# Patient Record
Sex: Male | Born: 1950 | Race: White | Hispanic: No | Marital: Married
Health system: Midwestern US, Academic
[De-identification: ages and names within clinical notes are randomized; demographics above are authoritative.]

---
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
## 2008-02-21 NOTE — Unmapped (Signed)
Signed by   LinkLog[employer] on 02/25/2008 at 08:44:31  Patient: Arthur Neal  Note: All result statuses are Final unless otherwise noted.    Tests: (1) DIAG-FLUORO UP TO 1 HOUR ([id number])    Order NotePricilla Handler Order Number: [id number]    Order Note:     *** VERIFIED [facility]  Reason:  FX  Dict.Staff: Gwenyth Ober [phone]    Verified By: Gwenyth Ober      Ver: 02/25/08   8:43 am  Exams:  DIAG-FLUORO UP TO 1 HOUR      Fluoroscopy performed on February 20, 2008 8:51:00 PM    INDICATION:    Fracture    Fluoroscopy was carried out, without a radiologist in  attendance, to provide patient care necessary for a specific  diagnostic or therapeutic procedure performed by a  nonradiologist.    A report by the performing physician should be available in the  medical record.  **** end of result ****    Order Note:   EMR Routing to: Aubery Lapping - ordering - [id number]  EMR Routing to: Bertram Savin, MD - admitting - [id number]    Note: An exclamation mark (!) indicates a result that was not dispersed into   the flowsheet.  Document Creation Date: 02/25/2008 8:44 AM  _______________________________________________________________________    (1) Order result status: Final  Collection or observation date-time: 02/21/2008 02:59:04  Requested date-time: 02/20/2008 20:51:00  Receipt date-time:   Reported date-time: 02/25/2008 08:43:36  Referring Physician: Nelida Gores REFERRAL PT  Ordering Physician: Marylyn Ishihara [facility])  Specimen Source:   Source: QRS  Filler Order Number: [id number]  Lab site: [facility]
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
## 2008-03-12 ENCOUNTER — Inpatient Hospital Stay

## 2008-03-13 NOTE — Unmapped (Signed)
Signed by   Link[employer] on 04/24/2008 at 11:13:16  Patient: Arthur Neal  Note: All result statuses are Final unless otherwise noted.    Tests: (1)  (MR)    Order Note:                                   THE [facility]     PATIENT NAME:   TODD, JELINSKI                       MR #:  [id number]  DATE OF BIRTH:  1951/08/20                        ACCOUNT #:  [id number]  SURGEON:        Beulah Gandy. Wyrick, M.D.              ROOM #:  [id number]  SERVICE:        Orthopedic Surgery                NURSING UNIT:  [facility]  PRIMARY:        Selected Referral Pt              FC:  C  REFERRING:      Selected Referral Pt              ADMIT DATE:  02/20/2008  DICTATED BY:    Beatriz Stallion, M.D.             SURGERY DATE:  03/13/2008                                                    DISCHARGE DATE:  04/03/2008                                    OPERATIVE REPORT        ADDENDUM 04/24/2008 - skb     Dr. Marylyn Ishihara was present for the entire case and was the attending  physician.                                                             _______________________________________  SB/skb                                 _____  D:  04/23/2008 15:59                  John D. Linna Darner, M.D.  T:  04/24/2008 11:03                  Dictated by:  Beatriz Stallion, M.D.  Job #:  [id number]     c:   Anesthesia  OPERATIVE REPORT                                                               PAGE    1 of   1    Note: An exclamation mark (!) indicates a result that was not dispersed into   the flowsheet.  Document Creation Date: 04/24/2008 11:13 AM  _______________________________________________________________________    (1) Order result status: Final  Collection or observation date-time: 03/13/2008 00:00  Requested date-time:   Receipt date-time:   Reported date-time:   Referring Physician: Selected Pt  Ordering Physician:  Reviewed In Hospital [facility])  Specimen Source:   Source: DBS  Filler Order Number: [id number]  Lab site:
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
## 2008-03-20 NOTE — Unmapped (Signed)
Signed by   LinkLogic on 03/23/2008 at 19:25:48  Patient: Arthur Neal  Note: All result statuses are Final unless otherwise noted.    Tests: (1)  (MR)    Order Note:                                   THE [facility]     PATIENT NAME:   Arthur Neal, Arthur Neal                       MR #:  [id number]  DATE OF BIRTH:  14-Dec-1950                        ACCOUNT #:  [id number]  SURGEON:        Beulah Gandy. Wyrick, M.D.              ROOM #:  BS06  SERVICE:        Orthopedic Surgery                NURSING UNIT:  [facility]  PRIMARY:        Selected Referral Pt              FC:  C  REFERRING:      Selected Referral Pt              ADMIT DATE:  02/20/2008  DICTATED BY:    Desmond Dike, M.D.               SURGERY DATE:  03/20/2008                                                    DISCHARGE DATE:                                    OPERATIVE REPORT        PREOPERATIVE DIAGNOSIS(ES):     1. Right below-the-knee amputation.     POSTOPERATIVE DIAGNOSIS(ES):     1. Right below-the-knee amputation.     PROCEDURE(S) PERFORMED:     1. Irrigation and debridement below-the-knee amputation stump.  2. Placement of wound-vac to right below-the-knee amputation stump.     SURGEON:  Beulah Gandy.  Wyrick M.D.     ASSISTANT:  Desmond Dike M.D.     ANESTHESIA:  General.     ESTIMATED BLOOD LOSS:  Less than 50 mL.     TOURNIQUET TIME:  17 minutes.     DRAINS:  None.     SPECIMENS:  One muscle.  Biopsy sent to microbiology for aerobic/anaerobic.     DETAILS OF PROCEDURE: The patient is a 57 year old man who has undergone  multiple irrigation and debridements of his right lower extremity.  He was  taken to the operating room on 03/17/08 for a right below-the-knee  amputation.  The wound was left open and closed with a wound-vac.  He is  being brought back to the operating room on 03/20/08 for irrigation and  debridement and possible revision of the right below-the-knee amputation  stump.  The patient was consented for this procedure preoperatively and  was  transferred to the  operating room on 03/20/08.  He was induced under general  anesthesia by the anesthesia department.  He was transferred onto the  operating room table and placed in a supine position.  A tourniquet was  applied to his upper right leg.  The right leg was then prepped and draped in  the usual sterile fashion. After elevating the leg for approximately one  minute, the tourniquet was inflated.  An appropriate time-out was performed.  It was noted that he did still have some necrotic muscle present,  specifically within his gastroc.  This muscle was debrided back until  bleeding muscle edges were seen.  All neurovascular structures were  protected.  The stump was then thoroughly irrigated with pulse-A-vac lavage,  a total of 9 liters with 3 liters containing Bacitracin.  A wound-vac was  then applied to the right below stump, noting to cover the deep tissues and  cover the exposed tibia and hardware with the vac sponge.  The wound-vac was  then noted to have an adequate seal when hooked to its cannister.  The  patient was then awoken from anesthesia without complications.  He was  transferred to the post anesthesia care unit postoperatively.  Dr. Linna Darner was  present and scrubbed for all key portions of this procedure.  Qkv                                                 _______________________________________  NN/bpb                                 _____  D:  03/20/2008 09:30                  John D. Linna Darner, M.D.  T:  03/20/2008 16:20                  Dictated by:  Desmond Dike, M.D.  Job #:  [id number]     c:   Anesthesia                                    OPERATIVE REPORT                                                               PAGE    1 of   1    Note: An exclamation mark (!) indicates a result that was not dispersed into   the flowsheet.  Document Creation Date: 03/23/2008 7:25 PM  _______________________________________________________________________    (1) Order result status:  Final  Collection or observation date-time: 03/20/2008 00:00  Requested date-time:   Receipt date-time:   Reported date-time:   Referring Physician: Selected Pt  Ordering Physician:  Reviewed In Hospital [facility])  Specimen Source:   Source: DBS  Filler Order Number: [id number]  Lab site:
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
## 2008-03-22 NOTE — Unmapped (Signed)
Signed by   LinkLog[employer] on 03/24/2008 at 00:27:27  Patient: [name]  Note: All result statuses are Final unless otherwise noted.    Tests: (1)  (MR)    Order Note:                                   THE [facility]     PATIENT NAME:   Arthur Neal, Arthur Neal                       MR #:  [id number]  DATE OF BIRTH:  06/21/1951                        ACCOUNT #:  [id number]  SURGEON:        Gareth Eagle, M.D.                ROOM #:  BS06  SERVICE:        Orthopedics                       NURSING UNIT:  [facility]  PRIMARY:        Selected Referral Pt              FC:  C  REFERRING:      Selected Referral Pt              ADMIT DATE:  02/20/2008  DICTATED BY:    Juanito Doom, M.D.               SURGERY DATE:  03/22/2008                                                    DISCHARGE DATE:                                    OPERATIVE REPORT        SURGEON:  Gareth Eagle, M.D.     ASSISTANT:  Juanito Doom, M.D.     PREOPERATIVE DIAGNOSIS:     1. Status post right below knee amputation, guillotine.     POSTOPERATIVE DIAGNOSIS:     1. Status post right below knee amputation, guillotine.     PROCEDURE PERFORMED:     1. Irrigation and debridement of right below knee amputation stump with    reapplication of VAC dressing.     ANESTHESIA:  General.     COMPLICATIONS:  None.     DISPOSITION:  To PACU in stable condition.     DETAILS OF PROCEDURE:  After informed consent had been obtained, the patient  was brought to the operating room and placed in the supine position with all  of his prominences appropriately padded.  A hold point had been appropriately  observed while the patient was awake and then general anesthesia was  successfully induced.     After the appropriate padding and positioning of the patient, the right lower  extremity was sterilely prepped and draped in the usual standard fashion.  Preoperative antibiotics were given.  The wound was inspected and no gross  purulence was noted.  There was no  gross necrotic tissue noted.   Distally at  the edge of the stump, however, some of the muscles from the lateral and  posterior compartments appeared pale and mildly necrotic at their edges.  These were appropriately debrided using a scalpel.  Then, a curet was used to  appropriately debride all of the visible tissue.  The plate was intact  showing no signs of loosening.  The bone also appeared to be healthy without  any signs of infection.     The wound was then copiously irrigated with a total of 9 liters of solution  with a Pulsavac.  The middle 3 liters had bacitracin antibiotic laced in it.  After the irrigation of the wound, the extremity was dried and then a VAC  dressing was appropriately applied.     The patient experienced no intraoperative complications.  Dr. Gareth Eagle  was immediately available for assistance.                                                                _______________________________________  OAK/km                                 _____  D:  03/22/2008 14:15                  Gareth Eagle, M.D.  T:  03/24/2008 00:22                  Dictated by:  Juanito Doom, M.D.  Job #:  [id number]     c:   Anesthesia                                    OPERATIVE REPORT                                                               PAGE    1 of   1    Note: An exclamation mark (!) indicates a result that was not dispersed into   the flowsheet.  Document Creation Date: 03/24/2008 12:27 AM  _______________________________________________________________________    (1) Order result status: Final  Collection or observation date-time: 03/22/2008 00:00  Requested date-time:   Receipt date-time:   Reported date-time:   Referring Physician: Selected Pt  Ordering Physician:  Reviewed In Hospital [facility])  Specimen Source:   Source: DBS  Filler Order Number: [id number]  Lab site:

## 2008-03-23 NOTE — Unmapped (Signed)
THE [facility]     PATIENT NAME:   RAINEY, KAHRS                       MR #:  [id number]   DATE OF BIRTH:  [birth year]                        ACCOUNT #:  [id number]   SURGEON:        Beulah Gandy. Wyrick, M.D.              ROOM #:  BS06   SERVICE:        Orthopedic Surgery                NURSING UNIT:  [facility]   PRIMARY:        Selected Referral Pt              FC:  C   REFERRING:      Selected Referral Pt              ADMIT DATE:  02/20/2008   DICTATED BY:    Desmond Dike, M.D.               SURGERY DATE:  03/20/2008                                                     DISCHARGE DATE:                                    OPERATIVE REPORT       PREOPERATIVE DIAGNOSIS(ES):     1. Right below-the-knee amputation.     POSTOPERATIVE DIAGNOSIS(ES):     1. Right below-the-knee amputation.     PROCEDURE(S) PERFORMED:     1. Irrigation and debridement below-the-knee amputation stump.   2. Placement of wound-vac to right below-the-knee amputation stump.     SURGEON:  Beulah Gandy.  Wyrick M.D.     ASSISTANT:  Desmond Dike M.D.     ANESTHESIA:  General.     ESTIMATED BLOOD LOSS:  Less than 50 mL.     TOURNIQUET TIME:  17 minutes.     DRAINS:  None.     SPECIMENS:  One muscle.  Biopsy sent to microbiology for aerobic/anaerobic.     DETAILS OF PROCEDURE: The patient is a 57 year old man who has undergone   multiple irrigation and debridements of his right lower extremity.  He was   taken to the operating room on 03/17/08 for a right below-the-knee   amputation.  The wound was left open and closed with a wound-vac.  He is   being brought back to the operating room on 03/20/08 for irrigation and   debridement and possible revision of the right below-the-knee amputation   stump.  The patient was consented for this procedure preoperatively and was   transferred to the operating room on 03/20/08.  He was induced under general   anesthesia by the anesthesia department.  He was transferred onto the   operating room  table and placed in a supine position.  A tourniquet was   applied to his upper right leg.  The right leg was then prepped and draped  in   the usual sterile fashion. After elevating the leg for approximately one   minute, the tourniquet was inflated.  An appropriate time-out was performed.   It was noted that he did still have some necrotic muscle present,   specifically within his gastroc.  This muscle was debrided back until   bleeding muscle edges were seen.  All neurovascular structures were   protected.  The stump was then thoroughly irrigated with pulse-A-vac lavage,   a total of 9 liters with 3 liters containing Bacitracin.  A wound-vac was   then applied to the right below stump, noting to cover the deep tissues and   cover the exposed tibia and hardware with the vac sponge.  The wound-vac was   then noted to have an adequate seal when hooked to its cannister.  The   patient was then awoken from anesthesia without complications.  He was   transferred to the post anesthesia care unit postoperatively.  Dr. Linna Darner was   present and scrubbed for all key portions of this procedure.   Qkv                                               _______________________________________   NN/bpb                                 _____   D:  03/20/2008 09:30                  John D. Linna Darner, M.D.   T:  03/20/2008 16:20                  Dictated by:  Desmond Dike, M.D.   Job #:  [id number]     c:   Anesthesia                                    OPERATIVE REPORT                                                                PAGE    1 of   1   T:  03/20/2008 16:20                  Dictated by:  Desmond Dike, M.D.   Job #:  [id number]     c:   Anesthesia                                    OPERATIVE REPORT                                                                PAGE    1 of   1

## 2008-03-24 NOTE — Unmapped (Signed)
THE [facility]     PATIENT NAME:   Arthur Neal, Arthur Neal                       MR #:  [id number]   DATE OF BIRTH:  Mar 12, 1951                        ACCOUNT #:  [id number]   SURGEON:        Gareth Eagle, M.D.                ROOM #:  BS06   SERVICE:        Orthopedics                       NURSING UNIT:  [facility]   PRIMARY:        Selected Referral Pt              FC:  C   REFERRING:      Selected Referral Pt              ADMIT DATE:  02/20/2008   DICTATED BY:    Juanito Doom, M.D.               SURGERY DATE:  03/22/2008                                                     DISCHARGE DATE:                                    OPERATIVE REPORT       SURGEON:  Gareth Eagle, M.D.     ASSISTANT:  Juanito Doom, M.D.     PREOPERATIVE DIAGNOSIS:     1. Status post right below knee amputation, guillotine.     POSTOPERATIVE DIAGNOSIS:     1. Status post right below knee amputation, guillotine.     PROCEDURE PERFORMED:     1. Irrigation and debridement of right below knee amputation stump with     reapplication of VAC dressing.     ANESTHESIA:  General.     COMPLICATIONS:  None.     DISPOSITION:  To PACU in stable condition.     DETAILS OF PROCEDURE:  After informed consent had been obtained, the patient   was brought to the operating room and placed in the supine position with all   of his prominences appropriately padded.  A hold point had been appropriately   observed while the patient was awake and then general anesthesia was   successfully induced.     After the appropriate padding and positioning of the patient, the right lower   extremity was sterilely prepped and draped in the usual standard fashion.   Preoperative antibiotics were given.  The wound was inspected and no gross   purulence was noted.  There was no gross necrotic tissue noted.  Distally at   the edge of the stump, however, some of the muscles from the lateral and   posterior compartments appeared pale and mildly necrotic at their  edges.   These were appropriately debrided using a scalpel.  Then, a curet was used to   appropriately  debride all of the visible tissue.  The plate was intact   showing no signs of loosening.  The bone also appeared to be healthy without   any signs of infection.     The wound was then copiously irrigated with a total of 9 liters of solution   with a Pulsavac.  The middle 3 liters had bacitracin antibiotic laced in it.   After the irrigation of the wound, the extremity was dried and then a VAC   dressing was appropriately applied.     The patient experienced no intraoperative complications.  Dr. Gareth Eagle   was immediately available for assistance.                                                         _______________________________________   OAK/km                                 _____   D:  03/22/2008 14:15                  Gareth Eagle, M.D.   T:  03/24/2008 00:22                  Dictated by:  Juanito Doom, M.D.   Job #:  [id number]     c:   Anesthesia                                    OPERATIVE REPORT                                                                PAGE    1 of   1   T:  03/24/2008 00:22                  Dictated by:  Juanito Doom, M.D.   Job #:  [id number]     c:   Anesthesia                                    OPERATIVE REPORT                                                                PAGE    1 of   1

## 2008-03-25 NOTE — Unmapped (Signed)
Signed by   [employer] on 03/25/2008 at 22:25:59  Patient: Arthur Neal  Note: All result statuses are Final unless otherwise noted.    Tests: (1)  (MR)    Order Note:                                   THE [facility]     PATIENT NAME:   Arthur Neal, Arthur Neal                       MR #:  [id number]  DATE OF BIRTH:  12/30/1950                        ACCOUNT #:  [id number]  SURGEON:        Andee Lineman, M.D.          ROOM #:  BS06  SERVICE:        Surgery                           NURSING UNIT:  [facility]  PRIMARY:        Selected Referral Pt              FC:  C  REFERRING:      Selected Referral Pt              ADMIT DATE:  02/20/2008  DICTATED BY:    Desmond Dike, M.D.               SURGERY DATE:  03/25/2008                                                    DISCHARGE DATE:                                    OPERATIVE REPORT        PREOPERATIVE DIAGNOSIS:     1. Right below-knee amputation.     POSTOPERATIVE DIAGNOSIS:     1. Right below-knee amputation.     PROCEDURES PERFORMED:     1. Repeat end-stage excision and debridement of right below-knee amputation    stump.  2. Application of wound VAC to right lower extremity.     SURGEON:  Inocente Salles. Archdeacon, M.D.     ASSISTANTS:     1. Beatriz Stallion, M.D.  2. Desmond Dike, M.D.     ANESTHESIA:  General.     DRAINS:  None.     BLOOD LOSS:  Less than 50 mL.     TOURNIQUET TIME:  None.     INDICATIONS FOR PROCEDURE:  The patient is a 57 year old male who is now  undergone multiple excision and debridement of the right lower extremity and  following a right below-knee amputation.  He has had multiple wound VAC  placed on his right lower extremity after each of these stage of the excision  and debridement.  He has been brought back to the operating room again on  03/05/2008 for another repeat end-stage excisional debridement, as well as  evaluation of possible closure.  Risks and benefits of the procedure would  discussed with the patient and his family and an informed  consent has been  obtained.     DESCRIPTION OF PROCEDURE:  On 03/25/2008, the patient was transferred to the  operating room and placed on the operating room table in a supine position.  A right time out was performed indicating the correct patient with two  identified antibiotics to be administered, allergies and appropriate  equipment as well as the procedure will be performed.  The patient was then  induced under general anesthesia by the anesthesia department.  His right  lower extremity then had a none sterile tourniquet applied to the proximal  aspect of his thigh.  The right lower extremity was then prepped and draped  in normal sterile fashion.  A second time out was then performed, again  reiterating the same back with [place] in the first time out.  The wound was  evaluated and it was noted that there was a consider amount of hardware and  distal to be exposed.  It was noted that even with reapproximating these  ________ and muscle coverage, there would still be remaining uncovered distal  tibia.  Also, there was concerned for persistent infection of the overlying  skin specifically proximal to the wound.  The wound was then debrided using  curette.  It was felt to be healthy muscle tissue present.  There was no  necrotic muscle tissue present at this time.  The wound was then thoroughly  irrigated with 9 liters of pulse-A-vac lavage with the middle 3 liters  containing bacitracin.  A large abdominal wound VAC was then applied to the  right lower extremity with an underlying layer Adaptic protecting the skin.  A good seal was noted to be head by the wound VAC upon connecting to the  wound VAC canisters.  The patient was the awokened from general anesthesia  and transferred to the PACU in stable condition.     Dr. [name] was present for all key portions of this procedure.                                                                _______________________________________  NN/em                                   _____  D:  03/25/2008 14:25                  Andee Lineman, M.D.  T:  03/25/2008 22:12                  Dictated by:  Desmond Dike, M.D.  Job #:  [id number]     c:   Anesthesia                                    OPERATIVE REPORT  PAGE    1 of   1    Note: An exclamation mark (!) indicates a result that was not dispersed into   the flowsheet.  Document Creation Date: 03/25/2008 10:25 PM  _______________________________________________________________________    (1) Order result status: Final  Collection or observation date-time: 03/25/2008 00:00  Requested date-time:   Receipt date-time:   Reported date-time:   Referring Physician: Selected Pt  Ordering Physician:  Reviewed In Hospital [facility])  Specimen Source:   Source: DBS  Filler Order Number: [id number]  Lab site:
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
## 2008-03-25 NOTE — Unmapped (Signed)
THE [facility]     PATIENT NAME:   Arthur Neal, Arthur Neal                       MR #:  [id number]   DATE OF BIRTH:  1951/08/22                        ACCOUNT #:  [id number]   SURGEON:        Andee Lineman, M.D.          ROOM #:  BS06   SERVICE:        Surgery                           NURSING UNIT:  [facility]SC   PRIMARY:        Selected Referral Pt              FC:  C   REFERRING:      Selected Referral Pt              ADMIT DATE:  02/20/2008   DICTATED BY:    Desmond Dike, M.D.               SURGERY DATE:  03/25/2008                                                     DISCHARGE DATE:                                    OPERATIVE REPORT       PREOPERATIVE DIAGNOSIS:     1. Right below-knee amputation.     POSTOPERATIVE DIAGNOSIS:     1. Right below-knee amputation.     PROCEDURES PERFORMED:     1. Repeat end-stage excision and debridement of right below-knee amputation     stump.   2. Application of wound VAC to right lower extremity.     SURGEON:  Inocente Salles. Archdeacon, M.D.     ASSISTANTS:     1. Beatriz Stallion, M.D.   2. Desmond Dike, M.D.     ANESTHESIA:  General.     DRAINS:  None.     BLOOD LOSS:  Less than 50 mL.     TOURNIQUET TIME:  None.     INDICATIONS FOR PROCEDURE:  The patient is a 57 year old male who is now   undergone multiple excision and debridement of the right lower extremity and   following a right below-knee amputation.  He has had multiple wound VAC   placed on his right lower extremity after each of these stage of the excision   and debridement.  He has been brought back to the operating room again on   03/05/2008 for another repeat end-stage excisional debridement, as well as   evaluation of possible closure.  Risks and benefits of the procedure would   discussed with the patient and his family and an informed consent has been   obtained.     DESCRIPTION OF PROCEDURE:  On 03/25/2008, the patient was transferred to the   operating room and placed on the operating room  table in a supine position.   A right  time out was performed indicating the correct patient with two   identified antibiotics to be administered, allergies and appropriate   equipment as well as the procedure will be performed.  The patient was then   induced under general anesthesia by the anesthesia department.  His right   lower extremity then had a none sterile tourniquet applied to the proximal   aspect of his thigh.  The right lower extremity was then prepped and draped   in normal sterile fashion.  A second time out was then performed, again   reiterating the same back with [place] in the first time out.  The wound was   evaluated and it was noted that there was a consider amount of hardware and   distal to be exposed.  It was noted that even with reapproximating these   ________ and muscle coverage, there would still be remaining uncovered distal   tibia.  Also, there was concerned for persistent infection of the overlying   skin specifically proximal to the wound.  The wound was then debrided using   curette.  It was felt to be healthy muscle tissue present.  There was no   necrotic muscle tissue present at this time.  The wound was then thoroughly   irrigated with 9 liters of pulse-A-vac lavage with the middle 3 liters   containing bacitracin.  A large abdominal wound VAC was then applied to the   right lower extremity with an underlying layer Adaptic protecting the skin.   A good seal was noted to be head by the wound VAC upon connecting to the   wound VAC canisters.  The patient was the awokened from general anesthesia   and transferred to the PACU in stable condition.     Dr. [name] was present for all key portions of this procedure.                                                         _______________________________________   NN/em                                  _____   D:  03/25/2008 14:25                  Andee Lineman, M.D.   T:  03/25/2008 22:12                  Dictated by:  Desmond Dike, M.D.   Job #:  [id number]     c:   Anesthesia                                    OPERATIVE REPORT                                                                PAGE    1 of   1
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
## 2008-04-03 NOTE — Unmapped (Signed)
Signed by Belva Chimes MD on 04/03/2008 at 00:00:00  Continuity of Care      Imported By: Arther Dames 04/03/2008 15:03:11    _____________________________________________________________________    External Attachment:    Please see [employer] EMR for this document.
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
## 2008-04-05 NOTE — Unmapped (Signed)
THE [place]     PATIENT NAME:   Arthur Neal, Arthur Neal                       MR #:  [id number]   DATE OF BIRTH:  December 10, 1950                        ACCOUNT #:  [id number]   PHYSICIAN:      Clydie Braun A. Nechama Guard, M.D.              ROOM #:  E101   SERVICE:        Pulmonary Diseases                NURSING UNIT:  D1E   PRIMARY:        Selected Referral Pt              FC:  C   REFERRING:      Zack Seal, M.D.             ADMIT DATE:  04/03/2008   DICTATED BY:    Eugenio Hoes. Nechama Guard, M.D.              PROCEDURE DATE:  04/03/2008                                                     DISCHARGE DATE:                                 PULMONARY FUNCTION TEST       Overnight pulse oximetry was recorded on room air for a period of seven to   eight hours.  Minimum saturation was 70%, maximum was 99, average was 95.   Minimum heart rate 77, maximum 104, average was 87.  The patient has multiple   episodes scattered throughout the study, particularly in the first two hours,   of mild desaturation down to 89%.  There are three distinct periods of   prolonged moderately severe desaturations.  The first occurs around hour two   to three, the second at around hour five to six and final around hour over   seven to eight of the study.  There is associated decrease in heart rate   associated with these episodes.     IMPRESSION:     1. Multiple prolonged episodes of desaturation which are more frequent during     the latter half of the study  and associated with change in heart rate.     This study is suspicious for the possibility of sleep apnea.  If clinically     applicable, would recommend full sleep studies in this patient.                                                   _______________________________________   KAB/jlm                                _____   D:  04/04/2008 08:36  Clydie Braun A. Nechama Guard, M.D.   T:  04/05/2008 23:33   Job #:  [id number]                                     PULMONARY FUNCTION TEST                                                             PAGE    1 of   1   D:  04/04/2008 08:36                   Clydie Braun A. Nechama Guard, M.D.   T:  04/05/2008 23:33   Job #:  [id number]                                     PULMONARY FUNCTION TEST                                                                PAGE    1 of   1

## 2008-04-16 ENCOUNTER — Inpatient Hospital Stay

## 2008-04-16 NOTE — Unmapped (Signed)
Signed by   [employer] on 04/22/2008 at 16:47:36  Patient: Arthur Neal  Note: All result statuses are Final unless otherwise noted.    Tests: (1) DIAG-FLUORO UP TO 1 HOUR ([id number])    Order NotePricilla Handler Order Number: [id number]    Order Note:     *** VERIFIED [facility]  Reason:  DVT'S IN UPPER ARM  Dict.Staff: Nash Mantis [phone]    Verified By: Nash Mantis       Ver: 04/22/08   4:47 pm  Exams:  DIAG-FLUORO UP TO 1 HOUR      Two views of the right knee, 3 views of the right hip,  Diagnostic Fluoroscopy up to 1 hour    History: Right hip fracture, right tibial fracture    Comparison: 03/17/2008, 02/21/2008    Findings:    AP and lateral views of the right tibia/fibula and 2 frontal  views of the pelvis, oblique view of the right hip are reviewed.  Again seen is the below-knee amputation and a lateral buttress  plate stabilizing the proximal tibial fracture. There is no  significant change since the prior study with regards to the  tibial fracture. The hardware appears intact. There is a healing  fibular neck fracture. There has been interval removal of the  surgical drain.    Again seen is the intertrochanteric fracture of the right hip  with ORIF. There is no evidence of hardware failure. Again seen  is a spinal stimulator overlying the right iliac wing. There  laminectomies and posterior fusion with bilateral pedicle screws  and rods at the L5-S1 level. Calcifications are seen within the  pelvis consistent with phleboliths.    Impression:    Right tibia and fibula:  1. No significant change in tibial fracture with stabilization  as described above.  2. Healing fibular neck fracture.    Right hip:  1. No change in right intertrochanteric hip fracture with  stabilization as described above.    Diagnostic Fluoroscopy up to 1 hour:  1. No attending radiologist was present for the fluoroscopic  portion of the procedure.    **** end of result ****    Order Note:   EMR Routing to: Robet Crutchfield, Roseanna Rainbow, MD -  ordering - [id number]    Note: An exclamation Xandrea Clarey (!) indicates a result that was not dispersed into   the flowsheet.  Document Creation Date: 04/22/2008 4:47 PM  _______________________________________________________________________    (1) Order result status: Final  Collection or observation date-time: 04/16/2008 17:30:00  Requested date-time: 04/16/2008 17:30:00  Receipt date-time:   Reported date-time: 04/22/2008 16:47:36  Referring Physician: Loraine Leriche Rankin Coolman  Ordering Physician: Evern [facility])  Specimen Source:   Source: QRS  Filler Order Number: [id number]  Lab site: [facility]
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
## 2008-04-16 NOTE — Unmapped (Signed)
Signed by   LinkLogic on 04/22/2008 at 16:45:35  Patient: Arthur Neal  Note: All result statuses are Final unless otherwise noted.    Tests: (1) DIAG-TIB/FIB MIN 2-VIEWS RT (161096)    Order NotePricilla Handler Order Number: [id number]    Order Note:     *** VERIFIED [facility]  Reason:  RT HIP FX, RT TIB FX  Dict.Staff: Nash Mantis [phone]    Verified By: Nash Mantis       Ver: 04/22/08   4:45 pm  Exams:  DIAG-HIP MIN 2-VIEWS RT      Two views of the right knee, 3 views of the right hip,  Diagnostic Fluoroscopy up to 1 hour    History: Right hip fracture, right tibial fracture    Comparison: 03/17/2008, 02/21/2008    Findings:    AP and lateral views of the right tibia/fibula and 2 frontal  views of the pelvis, oblique view of the right hip are reviewed.  Again seen is the below-knee amputation and a lateral buttress  plate stabilizing the proximal tibial fracture. There is no  significant change since the prior study with regards to the  tibial fracture. The hardware appears intact. There is a healing  fibular neck fracture. There has been interval removal of the  surgical drain.    Again seen is the intertrochanteric fracture of the right hip  with ORIF. There is no evidence of hardware failure. Again seen  is a spinal stimulator overlying the right iliac wing. There  laminectomies and posterior fusion with bilateral pedicle screws  and rods at the L5-S1 level. Calcifications are seen within the  pelvis consistent with phleboliths.    Impression:    Right tibia and fibula:  1. No significant change in tibial fracture with stabilization  as described above.  2. Healing fibular neck fracture.    Right hip:  1. No change in right intertrochanteric hip fracture with  stabilization as described above.    Diagnostic Fluoroscopy up to 1 hour:  1. No attending radiologist was present for the fluoroscopic  portion of the procedure.    **** end of result ****    Order Note:   EMR Routing to: Aubery Lapping -  ordering - [id number]    Note: An exclamation mark (!) indicates a result that was not dispersed into   the flowsheet.  Document Creation Date: 04/22/2008 4:45 PM  _______________________________________________________________________    (1) Order result status: Final  Collection or observation date-time: 04/16/2008 17:45:58  Requested date-time: 04/16/2008 17:33:00  Receipt date-time:   Reported date-time: 04/22/2008 16:45:35  Referring Physician: Marylyn Ishihara  Ordering Physician: Marylyn Ishihara [facility])  Specimen Source:   Source: QRS  Filler Order Number: [id number]  Lab site: [facility]
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
## 2008-04-22 NOTE — Unmapped (Signed)
Signed by Belva Chimes MD on 05/02/2008 at 10:11:12  [facility]      Imported By: Arther Dames 04/23/2008 09:53:12    _____________________________________________________________________    External Attachment:    Please see [employer] EMR for this document.
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
## 2008-04-23 ENCOUNTER — Inpatient Hospital Stay

## 2008-04-23 NOTE — Unmapped (Signed)
Signed by Belva Chimes MD on 04/23/2008 at 00:00:00  For treatment      Imported By: Arther Dames 05/01/2008 09:46:10    _____________________________________________________________________    External Attachment:    Please see [employer] EMR for this document.
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
## 2008-04-24 NOTE — Unmapped (Signed)
[facility]     PATIENT NAME:   Arthur Neal, Arthur Neal                       MR #:  [id number]   DATE OF BIRTH:  1951/07/05                        ACCOUNT #:  [id number]   SURGEON:        Beulah Gandy. Wyrick, M.D.              ROOM #:  [id number]   SERVICE:        Orthopedic Surgery                NURSING UNIT:  [facility]   PRIMARY:        Selected Referral Pt              FC:  C   REFERRING:      Selected Referral Pt              ADMIT DATE:  02/20/2008   DICTATED BY:    Beatriz Stallion, M.D.             SURGERY DATE:  03/13/2008                                                     DISCHARGE DATE:  04/03/2008                                    OPERATIVE REPORT       ADDENDUM 04/24/2008 - skb     Dr. Marylyn Ishihara was present for the entire case and was the attending   physician.                                                       _______________________________________   SB/skb                                 _____   D:  04/23/2008 15:59                  John D. Linna Darner, M.D.   T:  04/24/2008 11:03                  Dictated by:  Beatriz Stallion, M.D.   Job #:  [id number]     c:   Anesthesia                                    OPERATIVE REPORT  PAGE    1 of   1   Job #:  [id number]     c:   Anesthesia                                    OPERATIVE REPORT                                                                PAGE    1 of   1

## 2008-04-29 NOTE — Unmapped (Addendum)
Signed by Belva Chimes MD on 05/02/2008 at 10:12:12  [facility]      Imported By: Arther Dames 04/30/2008 11:34:54    _____________________________________________________________________    External Attachment:    Please see [employer] EMR for this document.  Signed by Belva Chimes MD on 05/16/2008 at 13:48:24            CBC/Diff:     Date:   04/28/2008  Performing Lab:   [facility]  RBC:   3.56 L  WBC:   7.6  Hemoglobin:   9.5 L  Hematocrit:   29.7 L  MCV:   83  MCH:   26.7  MCHC:   32.0  RDW 17.36 H  Platelet Count:   504 H  Absolute Neutrophils 3.11  Absolute Lymphocytes:   3.66  Absolute Monocytes:   0.61  Absolute Eosinophils:   0.18  Absolute Basophils:   0.04  % Neutrophils:   41  % Lymphocytes:   48  % Monocytes:   8  % Eosinophils:   2  % Basophils:   1    Recorded by: Eugenia Mcalpine MA on May 12, 2008 5:10 PM    ]

## 2008-04-30 NOTE — Unmapped (Signed)
Signed by Belva Chimes MD on 04/30/2008 at 00:00:00  [facility]      Imported By: Arther Dames 04/30/2008 10:49:38    _____________________________________________________________________    External Attachment:    Please see [employer] EMR for this document.

## 2008-05-05 NOTE — Unmapped (Addendum)
Signed by Belva Chimes MD on 05/09/2008 at 09:38:33  [facility]      Imported By: Arther Dames 05/07/2008 11:10:05    _____________________________________________________________________    External Attachment:    Please see [employer] EMR for this document.  Signed by Belva Chimes MD on 05/16/2008 at 13:48:54            CBC/Diff:     Date:   05/05/2008  Performing Lab:   [facility]  RBC:   3.63 L  WBC:   6.5  Hemoglobin:   9.5 L  Hematocrit:   29.6 L  MCV:   82  MCH:   26.2  MCHC:   32.1  RDW 17.2 H  Platelet Count:   479 H  Absolute Neutrophils 2.27  Absolute Lymphocytes:   3.33  Absolute Monocytes:   0.53  Absolute Eosinophils:   0.29  Absolute Basophils:   0.06  % Neutrophils:   35  % Lymphocytes:   51  % Monocytes:   8  % Eosinophils:   5  % Basophils:   1    Recorded by: Eugenia Mcalpine MA on May 12, 2008 5:06 PM    ]

## 2008-05-05 NOTE — Unmapped (Addendum)
Signed by Belva Chimes MD on 05/09/2008 at 09:38:03  [facility]      Imported By: Arther Dames 05/06/2008 10:39:18    _____________________________________________________________________    External Attachment:    Please see [employer] EMR for this document.  Signed by Belva Chimes MD on 05/16/2008 at 13:48:24            CBC/Diff:     Date:   05/05/2008  Performing Lab:   [facility]  RBC:   3.63 L  WBC:   6.5  Hemoglobin:   9.5 L  Hematocrit:   29.6 L  MCV:   82  MCH:   26.2  MCHC:   32.1  RDW 17.2 H  Platelet Count:   479 H  Absolute Neutrophils 2.27  Absolute Lymphocytes:   3.33  Absolute Monocytes:   0.53  Absolute Eosinophils:   0.29  Absolute Basophils:   0.06  % Neutrophils:   35  % Lymphocytes:   51 H  % Monocytes:   8  % Eosinophils:   5  % Basophils:   1    Recorded by: Eugenia Mcalpine MA on May 16, 2008 11:56 AM    ]

## 2008-05-07 ENCOUNTER — Inpatient Hospital Stay

## 2008-05-07 NOTE — Unmapped (Signed)
Signed by   [employer] on 05/07/2008 at 13:34:26  Patient: Arthur Neal  Note: All result statuses are Final unless otherwise noted.    Tests: (1) DIAG-TIB/FIB MIN 2-VIEWS RT ([id number])    Order NotePricilla Handler Order Number: [id number]    Order Note:     *** VERIFIED ***  [facility]  Reason:  RT PROXIMAL FEMUR FX, RT BKA  Dict.Staff: Catarina Hartshorn [id number]    Verified By: Catarina Hartshorn         Ver: 05/07/08   1:34 pm  Exams:  DIAG-HIP MIN 2-VIEWS RT      Examination: Right hip, 2 views and 2 views of the right tib-fib  dated 05/07/2008.    Indication: Right proximal femur fracture, right BKA.    Comparison: 04/16/2008.    Findings:    Again identified is a below-the-knee amputation and a lateral  buttress plate which stabilizes the proximal tibial fracture.  The lucent fracture line with the proximal tibia is still  identified, though in near anatomic alignment.    The healing fibular neck fracture is better seen on today's  study with associated bony callus formation.    Previously described intertrochanteric fracture the right hip is  also again seen with lateral plate and multiple screws  stabilizing. The spinal stimulator is noted overlying the right  iliac wing and portions of the right SI joint. Posterior fusion  rods are also noted the L5-S1.    Impression:    Pelvis: No significant change in healing intertrochanteric hip  fracture with stable appearance of hardware.    Right tibia/fibula: Stable appearance of healing tibia /fibula  fractures with stable hardware.  **** end of result ****    Order Note:   EMR Routing to: Aubery Lapping - ordering - [id number]    Note: An exclamation mark (!) indicates a result that was not dispersed into   the flowsheet.  Document Creation Date: 05/07/2008 1:34 PM  _______________________________________________________________________    (1) Order result status: Final  Collection or observation date-time: 05/07/2008 07:00:00  Requested date-time: 05/07/2008 07:00:00  Receipt  date-time:   Reported date-time: 05/07/2008 13:34:25  Referring Physician: Cindi Carbon NON-STAFF  Ordering Physician: Marylyn Ishihara [facility])  Specimen Source:   Source: QRS  Filler Order Number: [id number]  Lab site: [facility]

## 2008-05-07 NOTE — Unmapped (Signed)
Signed by   [employer]Log[employer] on 05/07/2008 at 13:34:25  Patient: Arthur Neal  Note: All result statuses are Final unless otherwise noted.    Tests: (1) DIAG-HIP MIN 2-VIEWS RT ([id number])    Order NotePricilla Handler Order Number: [id number]    Order Note:     *** VERIFIED ***  [facility]  Reason:  RT PROXIMAL FEMUR FX, RT BKA  Dict.Staff: Catarina Hartshorn [id number]    Verified By: Catarina Hartshorn         Ver: 05/07/08   1:34 pm  Exams:  DIAG-HIP MIN 2-VIEWS RT      Examination: Right hip, 2 views and 2 views of the right tib-fib  dated 05/07/2008.    Indication: Right proximal femur fracture, right BKA.    Comparison: 04/16/2008.    Findings:    Again identified is a below-the-knee amputation and a lateral  buttress plate which stabilizes the proximal tibial fracture.  The lucent fracture line with the proximal tibia is still  identified, though in near anatomic alignment.    The healing fibular neck fracture is better seen on today's  study with associated bony callus formation.    Previously described intertrochanteric fracture the right hip is  also again seen with lateral plate and multiple screws  stabilizing. The spinal stimulator is noted overlying the right  iliac wing and portions of the right SI joint. Posterior fusion  rods are also noted the L5-S1.    Impression:    Pelvis: No significant change in healing intertrochanteric hip  fracture with stable appearance of hardware.    Right tibia/fibula: Stable appearance of healing tibia /fibula  fractures with stable hardware.  **** end of result ****    Order Note:   EMR Routing to: Aubery Lapping - ordering - [id number]    Note: An exclamation mark (!) indicates a result that was not dispersed into   the flowsheet.  Document Creation Date: 05/07/2008 1:34 PM  _______________________________________________________________________    (1) Order result status: Final  Collection or observation date-time: 05/07/2008 07:00:00  Requested date-time: 05/07/2008 07:00:00  Receipt  date-time:   Reported date-time: 05/07/2008 13:34:25  Referring Physician: Cindi Carbon NON-STAFF  Ordering Physician: Marylyn Ishihara [facility])  Specimen Source:   Source: QRS  Filler Order Number: [id number]  Lab site: [facility]

## 2008-05-07 NOTE — Unmapped (Signed)
Signed by Franne Forts MD on 05/07/2008 at 00:00:00  [facility] 05/07/08-06/11/2008      Imported By: Alison Stalling MA 05/01/2009 19:11:33    _____________________________________________________________________    External Attachment:    Please see [employer] EMR for this document.

## 2008-05-12 LAB — OFFICE VISIT LAB RESULTS
Basophils Absolute: 0.04 10*3/uL
Basophils Absolute: 0.06 10*3/uL
Basophils Relative: 1 %
Basophils Relative: 1 %
CRP: 8.2 mg/dL
CRP: 8.2 mg/dL
Eosinophils Absolute: 0.18 10*3/uL
Eosinophils Absolute: 0.29 10*3/uL
Eosinophils Relative: 2 %
Eosinophils Relative: 5 %
Hematocrit: 29.6 %
Hematocrit: 29.7 %
Hemoglobin: 9.5 g/dL
Hemoglobin: 9.5 g/dL
INR: 2
Lymphocytes Absolute: 3.33 10*3/uL
Lymphocytes Absolute: 3.66 10*3/uL
Lymphocytes Relative: 48 %
Lymphocytes Relative: 51 %
MCH: 26.2 pg
MCH: 26.7 pg
MCHC: 32 g/dL
MCHC: 32.1 g/dL
MCV: 82 fL
MCV: 83 fL
Monocytes Absolute: 0.53 10*3/uL
Monocytes Absolute: 0.61 10*3/uL
Monocytes Relative: 8 %
Monocytes Relative: 8 %
Neutrophils Absolute: 2.27 10*3/uL
Neutrophils Absolute: 3.11 10*3/uL
Neutrophils Relative: 35 %
Neutrophils Relative: 41 %
Platelets: 479 10*3/uL
Platelets: 504 10*3/uL
Protime: 18.4
RBC: 3.56 10*6/uL
RBC: 3.63 10*6/uL
RDW: 17.2 %
RDW: 17.36 %
Sed Rate: 33 mm/hr
Sed Rate: 42 mm/hr
WBC: 6.5 10*3/uL
WBC: 7.6 10*3/uL

## 2008-05-12 NOTE — Unmapped (Signed)
Signed by Non-EMR  Physician on 05/12/2008 at 00:00:00  [facility]      Imported By: Arther Dames 05/14/2008 15:40:21    _____________________________________________________________________    External Attachment:    Please see [facility] EMR for this document.

## 2008-05-12 NOTE — Unmapped (Addendum)
Signed by Belva Chimes MD on 05/16/2008 at 13:41:55  [facility]      Imported By: Arther Dames 05/13/2008 14:24:56    _____________________________________________________________________    External Attachment:    Please see [employer] EMR for this document.  Signed by Belva Chimes MD on 06/02/2008 at 15:44:13            CBC/Diff:     Date:   05/12/2008  Performing Lab:   [facility]  RBC:   3.43 L  WBC:   6.8  Hemoglobin:   9.1 L  Hematocrit:   27.3 L  MCV:   80  MCH:   26.5  MCHC:   33.3  RDW 17.2 H  Platelet Count:   439  Absolute Neutrophils 2.71  Absolute Lymphocytes:   3.25  Absolute Monocytes:   0.64  Absolute Eosinophils:   0.15  Absolute Basophils:   0.05  % Neutrophils:   40  % Lymphocytes:   48  % Monocytes:   9  % Eosinophils:   2  % Basophils:   1    Recorded by: Eugenia Mcalpine MA on May 20, 2008 9:37 AM    ]

## 2008-05-19 NOTE — Unmapped (Addendum)
Signed by Belva Chimes MD on 06/02/2008 at 15:44:13  [facility]      Imported By: Arther Dames 05/22/2008 15:21:41    _____________________________________________________________________    External Attachment:    Please see [employer] EMR for this document.  Signed by Belva Chimes MD on 07/14/2008 at 13:03:16            CBC/Diff:     Date:   05/19/2008  Performing Lab:   [facility]  RBC:   3.65 L  WBC:   6.9  Hemoglobin:   9.5 L  Hematocrit:   29.1 L  MCV:   80  MCH:   26.0  MCHC:   32.6  RDW 17.4 H  Platelet Count:   348  Absolute Neutrophils 2.89  Absolute Lymphocytes:   3.16  Absolute Monocytes:   0.58  Absolute Eosinophils:   0.18  Absolute Basophils:   0.04  % Neutrophils:   42  % Lymphocytes:   46  % Monocytes:   9  % Eosinophils:   3  % Basophils:   1    Recorded by: Eugenia Mcalpine MA on July 08, 2008 2:48 PM    ]

## 2008-05-26 NOTE — Unmapped (Signed)
Signed by Belva Chimes MD on 06/02/2008 at 15:44:13  [facility]      Imported By: Arther Dames 05/27/2008 11:51:38    _____________________________________________________________________    External Attachment:    Please see [employer] EMR for this document.

## 2008-05-26 NOTE — Unmapped (Addendum)
Signed by Belva Chimes MD on 06/02/2008 at 15:44:13  [facility]      Imported By: Arther Dames 06/02/2008 11:19:41    _____________________________________________________________________    External Attachment:    Please see [employer] EMR for this document.  Signed by Belva Chimes MD on 07/14/2008 at 13:03:16            CBC/Diff:     Date:   05/26/2008  Performing Lab:   [facility]  RBC:   3.62 L  WBC:   7.7  Hemoglobin:   9.5 L  Hematocrit:   28.9 L  MCV:   80  MCH:   26.2  MCHC:   32.9  RDW 17.1 H  Platelet Count:   428 H  Absolute Neutrophils 3.64  Absolute Lymphocytes:   3.01  Absolute Monocytes:   0.70  Absolute Eosinophils:   0.27  Absolute Basophils:   0.06  % Neutrophils:   47  % Lymphocytes:   39  % Monocytes:   9  % Eosinophils:   4  % Basophils:   1    Recorded by: Eugenia Mcalpine MA on July 08, 2008 3:41 PM    ]
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
## 2008-06-03 NOTE — Unmapped (Addendum)
Signed by Belva Chimes MD on 06/09/2008 at 16:50:16  [facility]       Imported By: Arther Dames 06/04/2008 18:19:01    _____________________________________________________________________    External Attachment:    Please see [facility] EMR for this document.  Signed by Belva Chimes MD on 06/16/2008 at 09:21:19            CBC/Diff:     Date:   06/03/2008  Performing Lab:   [facility]  RBC:   3.56 L  WBC:   5.7  Hemoglobin:   9.2 L  Hematocrit:   28.4 L  MCV:   80  MCH:   25.8  MCHC:   32.4  RDW 17.5 H  Platelet Count:   455 H  Absolute Neutrophils 2.15  Absolute Lymphocytes:   2.74  Absolute Monocytes:   0.58  Absolute Eosinophils:   0.15  Absolute Basophils:   0.04  % Neutrophils:   38  % Lymphocytes:   48  % Monocytes:   10  % Eosinophils:   3  % Basophils:   1    Recorded by: Eugenia Mcalpine MA on June 09, 2008 5:38 PM    ]

## 2008-06-04 ENCOUNTER — Inpatient Hospital Stay

## 2008-06-04 NOTE — Unmapped (Signed)
Signed by Belva Chimes MD on 06/04/2008 at 00:00:00  [facility]      Imported By: Arther Dames 06/23/2008 11:18:21    _____________________________________________________________________    External Attachment:    Please see [facility] EMR for this document.

## 2008-06-04 NOTE — Unmapped (Signed)
Signed by   LinkLogic on 06/05/2008 at 08:10:37  Patient: Arthur Neal  Note: All result statuses are Final unless otherwise noted.    Tests: (1) DIAG-TIB/FIB MIN 2-VIEWS RT (161096)    Order NotePricilla Handler Order Number: [id number]    Order Note:     *** VERIFIED ***  [facility] [employer] BUILDING  Reason:  RT BELOW THE KNEE AMUPTATION, RT INTERTROCH FEMUR FX  Dict.Staff: Azucena Cecil [phone]    Verified By: Julio Sicks B        Ver: 06/05/08   8:10 am  Exams:  DIAG-HIP MIN 2-VIEWS RT      RIGHT TIBIA AND FIBULA, AP AND LATERAL VIEWS PERFORMED ON  06/04/2008:    INDICATION: Right below-the-knee amputation.    IMPRESSION:    There has been a previous below-the-knee amputation.  There has  been a previous fracture of the fibular neck and a fracture  across the tibial metaphysis. There is a lateral buttress plate  in place stabilizing the tibial fracture.  There is adequate  amount of soft tissues covering the distal end of the tibia and  fibula.  There is some beginning bridging between the distal  tibia and fibula.      RIGHT HIP, THREE VIEWS:    INDICATION: Right intertrochanteric fracture.    COMPARISON: 04/16/2008    IMPRESSION:    There has been an intertrochanteric fracture with lateral plate  held in place by screws and screws passing across the plate into  the intertrochanteric area, femoral neck and head.  There is  some bony densities lateral to the greater trochanter.  There  has been previous laminectomy at L5 with posterior rods and  screws, a spacer and a spinal stimulator in place. There has  been no significant change from previous study.  **** end of result ****    Order Note:   EMR Routing to: Aubery Lapping - ordering - [id number]    Note: An exclamation mark (!) indicates a result that was not dispersed into   the flowsheet.  Document Creation Date: 06/05/2008 8:10 AM  _______________________________________________________________________    (1) Order result status: Final  Collection or observation date-time:  06/04/2008 12:33:48  Requested date-time: 06/04/2008 12:28:00  Receipt date-time:   Reported date-time: 06/05/2008 08:10:32  Referring Physician: Marylyn Ishihara  Ordering Physician: Marylyn Ishihara [facility])  Specimen Source:   Source: QRS  Filler Order Number: [id number]  Lab site: [facility]

## 2008-06-04 NOTE — Unmapped (Signed)
Signed by   LinkLogic on 06/05/2008 at 08:10:37  Patient: Arthur Neal  Note: All result statuses are Final unless otherwise noted.    Tests: (1) DIAG-HIP MIN 2-VIEWS RT (161096)    Order NotePricilla Handler Order Number: [id number]    Order Note:     *** VERIFIED ***  MEDICAL ARTS BUILDING  Reason:  RT BELOW THE KNEE AMUPTATION, RT INTERTROCH FEMUR FX  Dict.Staff: Azucena Cecil [phone]    Verified By: Julio Sicks B        Ver: 06/05/08   8:10 am  Exams:  DIAG-HIP MIN 2-VIEWS RT      RIGHT TIBIA AND FIBULA, AP AND LATERAL VIEWS PERFORMED ON  06/04/2008:    INDICATION: Right below-the-knee amputation.    IMPRESSION:    There has been a previous below-the-knee amputation.  There has  been a previous fracture of the fibular neck and a fracture  across the tibial metaphysis. There is a lateral buttress plate  in place stabilizing the tibial fracture.  There is adequate  amount of soft tissues covering the distal end of the tibia and  fibula.  There is some beginning bridging between the distal  tibia and fibula.      RIGHT HIP, THREE VIEWS:    INDICATION: Right intertrochanteric fracture.    COMPARISON: 04/16/2008    IMPRESSION:    There has been an intertrochanteric fracture with lateral plate  held in place by screws and screws passing across the plate into  the intertrochanteric area, femoral neck and head.  There is  some bony densities lateral to the greater trochanter.  There  has been previous laminectomy at L5 with posterior rods and  screws, a spacer and a spinal stimulator in place. There has  been no significant change from previous study.  **** end of result ****    Order Note:   EMR Routing to: Aubery Lapping - ordering - [id number]    Note: An exclamation mark (!) indicates a result that was not dispersed into   the flowsheet.  Document Creation Date: 06/05/2008 8:10 AM  _______________________________________________________________________    (1) Order result status: Final  Collection or observation date-time:  06/04/2008 12:33:22  Requested date-time: 06/04/2008 12:28:00  Receipt date-time:   Reported date-time: 06/05/2008 08:10:32  Referring Physician: Marylyn Ishihara  Ordering Physician: Marylyn Ishihara [facility])  Specimen Source:   Source: QRS  Filler Order Number: [id number]  Lab site: [facility]

## 2008-06-05 NOTE — Unmapped (Signed)
Signed by Belva Chimes MD on 06/09/2008 at 16:50:16  [facility]      Imported By: Arther Dames 06/06/2008 08:59:00    _____________________________________________________________________    External Attachment:    Please see [facility] EMR for this document.

## 2008-06-20 NOTE — Unmapped (Signed)
Signed by Belva Chimes MD on 06/20/2008 at 00:00:00  [facility]      Imported By: Arther Dames 06/20/2008 15:01:01    _____________________________________________________________________    External Attachment:    Please see [facility] EMR for this document.

## 2008-06-24 ENCOUNTER — Inpatient Hospital Stay

## 2008-07-02 ENCOUNTER — Inpatient Hospital Stay

## 2008-07-16 ENCOUNTER — Inpatient Hospital Stay
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
## 2008-07-16 NOTE — Unmapped (Signed)
Signed by   LinkLogic on 07/17/2008 at 09:07:18  Patient: Arthur Neal  Note: All result statuses are Final unless otherwise noted.    Tests: (1) DIAG-HIP MIN 2-VIEWS RT (956213)    Order NotePricilla Handler Order Number: [id number]    Order Note:     *** VERIFIED ***  MEDICAL ARTS BUILDING  Reason:  RT INTEROCH HIP FX, RT PROXIMAL TIBIA FX-INCLUDE KNEE,  RT K\\BKA  Dict.Staff: Azucena Cecil [phone]    Verified By: Azucena Cecil        Ver: 07/17/08   9:06 am  Exams:  DIAG-HIP MIN 2-VIEWS RT      AP PELVIS AND RIGHT HIP PERFORMED ON 07/16/2008:    INDICATION: Right intertrochanteric fracture.    COMPARISON: 06/04/2008    IMPRESSION:    There has been a previous intertrochanteric fracture with  lateral plate.  Some of the proximal screws enter the femoral  neck and head and more distal screws stabilize the plate.  Some  bony densities are seen lateral to the greater trochanter. There  has been previous L5 laminectomy with posterior rods and screws,  a spacer and spinal stimulator noted. There are some small  metallic densities in the medial portion of the proximal thigh.  There has been no change.      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 There has  been a previous fracture of the fibula head and neck and a  fracture across the tibial metaphysis.  There is a lateral  buttress plate stabilizing the tibial fracture and no change  from the previous study.  **** end of result ****    Order Note:   EMR Routing to: Aubery Lapping - ordering - [id number]    Note: An exclamation mark (!) indicates a result that was not dispersed into   the flowsheet.  Document Creation Date: 07/17/2008 9:07 AM  _______________________________________________________________________    (1) Order result status: Final  Collection or observation date-time: 07/16/2008 15:58:32  Requested date-time: 07/16/2008 15:50:00  Receipt date-time:   Reported date-time: 07/17/2008  09:06:36  Referring Physician: Cindi Carbon NON-STAFF  Ordering Physician: Marylyn Ishihara [facility])  Specimen Source:   Source: QRS  Filler Order Number: [id number]  Lab site: [facility]
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
## 2008-07-30 ENCOUNTER — Inpatient Hospital Stay

## 2008-08-13 ENCOUNTER — Inpatient Hospital Stay
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
## 2008-08-19 ENCOUNTER — Inpatient Hospital Stay

## 2008-08-27 ENCOUNTER — Inpatient Hospital Stay
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
## 2008-09-10 ENCOUNTER — Inpatient Hospital Stay
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
## 2008-10-01 ENCOUNTER — Inpatient Hospital Stay
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
## 2008-10-29 ENCOUNTER — Inpatient Hospital Stay
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
## 2008-11-12 ENCOUNTER — Inpatient Hospital Stay
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
## 2008-11-26 ENCOUNTER — Inpatient Hospital Stay

## 2008-11-26 NOTE — Unmapped (Signed)
Signed by Ivar Drape RN on 11/26/2008 at 15:01:39
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
## 2009-01-07 ENCOUNTER — Inpatient Hospital Stay

## 2009-01-14 ENCOUNTER — Inpatient Hospital Stay
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
## 2009-05-07 ENCOUNTER — Inpatient Hospital Stay
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
## 2009-05-11 NOTE — Unmapped (Signed)
Signed by Aubery Lapping MD on 05/11/2009 at 00:00:00  Orthopaedics      Imported By: Coletta Memos 10/20/2009 21:39:29    _____________________________________________________________________    External Attachment:    Please see [facility] EMR for this document.

## 2009-08-19 ENCOUNTER — Inpatient Hospital Stay

## 2009-08-19 NOTE — Unmapped (Signed)
Signed by Aubery Lapping MD on 08/19/2009 at 00:00:00  Orthopaedics      Imported By: Betsey Amen 10/23/2009 11:02:45    _____________________________________________________________________    External Attachment:    Please see [facility] EMR for this document.

## 2009-08-19 NOTE — Unmapped (Signed)
Signed by   LinkLogic on 08/20/2009 at 10:26:15  Patient: Arthur Neal  Note: All result statuses are Final unless otherwise noted.    Tests: (1) DIAG-TIB/FIB MIN 2-VIEWS RT (161096)    Order Note: [facility]Net Accession Number: [id number]    Order Note:                               [facility]     Patient: Arthur Neal, Arthur Neal   DOB:     1950/09/05   MRN:     [id number]   FIN:       [id number]   Accn#:   [id number]                                  Diagnostic Radiology     Exam                       Exam Date/Time       Ordering Physician   DIAG-TIB/FIB MIN 2-VIEWS   08/19/2009 14:54 EST Artemis Koller MD, Beulah Gandy   RT     Reason for Exam   RT TIBIA PAIN     Report   RIGHT KNEE AND PROXIMAL LOWER LEG, AP AND LATERAL VIEWS PERFORMED ON   08/19/2009:     COMPARISON: 11/12/2008     IMPRESSION:     There has been fracture of the proximal tibia stabilized by buttress plate   and showing more healing now.  There is a lateral plate which is unchanged.   The proximal fibular fracture is again noted.  There has been below the   knee amputation.   ********** VERIFIED REPORT **********     Dictated:                         Julio Sicks   Signed (Electronic Signature):  Julio Sicks                   08/20/09   10:25   Transcribed by: JDB   Technologist: MLE    Order Note:   EMR Routing to: Aubery Lapping - ordering - [id number]    Note: An exclamation mark (!) indicates a result that was not dispersed into   the flowsheet.  Document Creation Date: 08/20/2009 10:26 AM  _______________________________________________________________________    (1) Order result status: Final  Collection or observation date-time: 08/19/2009 14:54:47  Requested date-time: 08/19/2009 14:47:00  Receipt date-time:   Reported date-time: 08/20/2009 10:25:39  Referring Physician: Marylyn Ishihara  Ordering Physician: Marylyn Ishihara [facility])  Specimen Source: RAD&Rad Type  Source: QRS  Filler Order Number: [id number]  Lab site: [facility]

## 2009-10-07 NOTE — Unmapped (Signed)
Signed by Aubery Lapping MD on 10/07/2009 at 00:00:00  Orthopaedics      Imported By: Maryellen Pile 11/01/2009 08:27:17    _____________________________________________________________________    External Attachment:    Please see [facility] EMR for this document.

## 2009-11-04 NOTE — Unmapped (Signed)
Signed by Gerre Pebbles MD on 11/04/2009 at 00:00:00  Transferred Records       Imported By: Coletta Memos 11/12/2009 09:12:49    _____________________________________________________________________    External Attachment:    Please see [facility] EMR for this document.

## 2009-11-05 NOTE — Unmapped (Signed)
Signed by Aubery Lapping MD on 11/05/2009 at 00:00:00  Physician Order for DME      Imported By: Maryellen Pile 12/02/2009 09:22:39    _____________________________________________________________________    External Attachment:    Please see [facility] EMR for this document.

## 2009-11-10 NOTE — Unmapped (Signed)
Signed by Dewayne Shorter MA on 11/10/2009 at 09:34:35    Clinical Lists Changes    Orders:  Added new Test order of Endoscopic ultrasound  (EUS) ([id number]) - Signed

## 2009-11-10 NOTE — Unmapped (Signed)
Signed by Dewayne Shorter MA on 11/10/2009 at [facility]    [facility]  Division of Digestive Diseases        SURGERY / PROCEDURE SCHEDULE SHEET     Requested Date: 11/23/2009    Arrival Time: 11:00am    Requested Time: 1:00pm    Length of Surgery: 60 minutes    Comment: Referral: Bari Edward, MD      Physician: Floreen Comber MD    Facility: [facility]    Type of patient: Internal Referral    Medications:   NEURONTIN 800 MG TABS (GABAPENTIN) 1 tab three times a day  MAG-OX 400  TABS (MAGNESIUM OXIDE TABS) QD  ORAZINC 220 MG CAPS (ZINC SULFATE) QD  OMEPRAZOLE  TBEC (OMEPRAZOLE TBEC) 50 mg qd  BYSTOLIC 20 MG TABS (NEBIVOLOL HCL)   OCUVITE  TABS (MULTIPLE VITAMINS-MINERALS) 1 tab daily  LYCOPENE  CAPS (LYCOPENE) 1 daily  DIOVAN HCT 320-12.5 MG TABS (VALSARTAN-HYDROCHLOROTHIAZIDE) 1 tab daily  KLOR-CON 10 10 MEQ CR-TABS (POTASSIUM CHLORIDE) 1 by mouth daily  LASIX 40 MG TABS (FUROSEMIDE) 1 by mouth daily    Allergies: ! * RASH  ! KEFLEX  ! AMITRIPTYLINE HCL (AMITRIPTYLINE HCL)  ! 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    Patient Information:     Name: Arthur Neal    DOB: October 08, 1950    SSN: [id number]    Address: [address]  [place], [place]  [address]    Gender: Male    Home phone: [phone]    Work phone: [phone]    IDX #: [id number]    Last Word #: [id number]    PCP: Arthur Neal    Insurance Information:     Primary Insurance: [employer]    Member ID #: [id number]

## 2009-11-10 NOTE — Unmapped (Signed)
Signed by Dewayne Shorter MA on 11/10/2009 at 09:31:35    Printed Handout:  - Directions to [facility]

## 2009-11-10 NOTE — Unmapped (Signed)
Signed by Dewayne Shorter MA on 11/10/2009 at 09:31:35    Printed Handout:  - EGD, ENTEROSCOPY, ERCP and EUS PREPARATION FORM

## 2009-11-11 NOTE — Unmapped (Signed)
Signed by Verlene Mayer on 11/11/2009 at 10:03:27    ---- Converted from flag ----  ---- 11/10/2009 4:15 PM, Verlene Mayer wrote:  ---- 11/10/2009 9:35 AM, Dewayne Shorter MA wrote:  EUS  Outpatient  [facility]  ------------------------------  PHONE NOTE    PHONE NOTE  Call placed to: [employer]  Reason for Call: diagnostic test pre-authorization  Details for Reason:   [id number]  ICD-577.2 CYST AND PSEUDOCYST OF PANCREAS  Summary of call: Insurance Company: Francine Graven  [phone]  REP: HARRIETT  Precert Required ? NO PRIOR AUTH REQUIRED  Call placed by: Verlene Mayer,  November 11, 2009 10:03 AM

## 2009-11-23 ENCOUNTER — Inpatient Hospital Stay: Attending: Gastroenterology

## 2009-11-23 NOTE — Unmapped (Signed)
Signed by   LinkLogic on 11/23/2009 at 15:52:25  Patient: Arthur Neal  Note: All result statuses are Final unless otherwise noted.    Tests: (1) Upper Endoscopic Ultrasound NEW (Upper EUS)    Order Note: Indications: 59 y.o. male who had a CT pulmonary angiogram on   01/19/2009 showing a low density lesion in the pancreas. A follow-up   CT on 01/28/2009 showed a 9 mm low density lesion in the body of the   pancreatic body (duodenal diverticulum also noted) & 6 month f/u was   recommended. He was admitted to [facility],   [place], [place] on 11/02/2009 with chest/abdominal pain. MI and PE ruled   out. Abdominal CT showed a complex cystic and solid mass in the head   of the pancreas measuring 2.9 cm. A second 9 mm cyt was seen int he   body of the pancreas. The main duct was dilated at 5 mm. Inflammatory   changes were noted in the adipose between the pancreas and posterior   wall of the stomach. Mild gerneralized stranding was noted about the   pancreatic head. Serum lipase was elevated at 873 (nl 73-373) but   amylase was normal during hospitalization. EUS was requested for   further evaluation. Referring MD: Dr. Bari Edward.     History: Detailed clinical information is available in the recent   clinic/consult report.     Consent: The benefits, risks, and alternatives to the procedure were   discussed, and informed consent was obtained from the patient.     Preparation:     Medications:     Procedure: A linear echoendoscope w as advanced from the mouth to D2.   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     Findings:     EUS Findings: EUS imaging from the proximal stomach revealed moderate   edema of the posterior gastric wall which measured approximately 8.6   mm. A hypodense inhomogeneous lesion measuring 22 x 14 mm was seen in   the neck of the pancreas. Of note, this area was immediately   posterior the region of gastric wall edema. FNA x 4 passes was   performed. A cytotechnician was  available to process the specimens. A   9.3. 11.4 round hypoechoic lesion with internal debris, possibly   representing a cyst, was also seen in the neck of the pancreas   (medial the previously described hypodense lesion). A 25.9 x 20.7 mm   hypoechoic lesion was seen in the uncinate process, seen best from   D2. Three to four attempts were made to sample the lesion were using   a 25 gauge needle, however, a specimen could not be obtained due to   unstable scope position. Other parenchymal changes included   hyperechoic foci and strands in the pancreatic body. The remainder of   the head of pancreas appeared normal and no discrete cyst were seen   in the head. The main panreatic duct measured 2 mm in the body and   was normal in course and contour. The CBD and gallbladder appeared   normal. The left hepatic lobe appeared normal. No ascites. Vascular   structrues including PV, SMV, SMA, HA, and celiac axis were normal.     Estimated Blood Loss: None.     Intraprocedural Events: There were no unplanned events during the   procedure.     Post Proc Diagnostic Conclusion: Edema of the posterior gastric wall   with hypodense  lesions in the neck and uncinate process of the   pancreas. FNA x 4 passes were performed from the pancreatic neck   lesion. Another 9 x 11 mm round hypodense was seen in the neck of the   pancreas, possible representing a cyst. No discrete cyst was seen in   the head of the pancreas. Overall, the findings are most compatible   with resolving pancreatitis but will await cytology results.     Recommendations: - Await cytology - Continue pancreatic enzyme   supplements (currently on Zenpep) - Abstain from ETOH - Repeat CT   scan in 6 months or as clincally indicated     Post-Procedure Data:     Procedure Codes:43242     Performed By: The procedure was performed by Floreen Comber, MD.     Comments:  Version 1, electronically signed by Dr. Floreen Comber on 11/23/2009 at   15:52.    Note: An exclamation mark  (!) indicates a result that was not dispersed into   the flowsheet.  Document Creation Date: 11/23/2009 3:52 PM  _______________________________________________________________________    (1) Order result status: Final  Collection or observation date-time: 11/23/2009 15:06  Requested date-time: 11/23/2009 13:00  Receipt date-time:   Reported date-time: 11/23/2009 15:52  Referring Physician: Vista Mink  Ordering Physician:  Reviewed In Hospital [facility])  Specimen Source:   Source: DBS  Filler Order Number: [id number]  Lab site: 1

## 2009-11-23 NOTE — Unmapped (Signed)
Signed by Gerre Pebbles MD on 11/23/2009 at 00:00:00  Upper Endoscopic Ultrasound      Imported By: Coletta Memos 12/07/2009 16:21:23    _____________________________________________________________________    External Attachment:    Please see [facility] EMR for this document.

## 2009-11-24 NOTE — Unmapped (Signed)
Signed by Gerre Pebbles MD on 11/24/2009 at 18:33:42    PHONE NOTE    PHONE NOTE  Reason for Call: discuss lab or test results  Summary of call: I spoke to Arthur Neal by telephone today and discuss the FNA results.  I asked him to f/u with Dr. Bari Edward and will fax results to his office.  Call placed by: Gerre Pebbles MD,  November 24, 2009 6:33 PM

## 2009-12-24 NOTE — Unmapped (Signed)
Signed by Dewayne Shorter MA on 12/24/2009 at 12:35:29    ---- Converted from flag ----  ---- 12/23/2009 3:09 PM, Gerre Pebbles MD wrote:  Ardis Rowan,       They asked me about this on the day he had his EUS here.  I told them that I wanted to avoid taking over care of patients who were referred for a specific procedure.  In other words, it's not a good idea to steal patients from other physicians.  I spoke to Dr. Wyline Mood about the findings, made recommendations, and sent reports to his office.  I suggest they continue to follow-up with him.    ---- 12/23/2009 1:54 PM, Dewayne Shorter MA wrote:  Doc,  Patient's wife Selena Batten called to request that the patient be scheduled for a new patient office visit. Patient had an EUS on 11-23-09. Per the wife, patient did follow up visit with referring physician Bari Edward but is dissatisfied with his care and wants to be seen by you in the clinic. Please advise if this is ok to schedule. Patient's wife left a contact number of [phone].  Ardis Rowan  ------------------------------PHONE NOTE      Initial call taken by: Dewayne Shorter MA,  December 24, 2009 12:30 PM  PHONE NOTE  Call placed to: Patient's wife-Kim  Reason for Call: confirm/change appointment  Action Taken: phone call completed  Summary of call: Patient's wife called requesting a new patient office visit  Call placed by: Dewayne Shorter MA,  December 24, 2009 12:32 PM    Informed her that Dr. Katrinka Blazing suggested that the patient continues to follow up with referring physician Bari Edward, MD.  Ardis Rowan

## 2010-01-07 NOTE — Unmapped (Signed)
Signed by Aubery Lapping MD on 01/07/2010 at 00:00:00  Prior Authorization      Imported By: Betsey Amen 02/01/2010 13:07:26    _____________________________________________________________________    External Attachment:    Please see [facility] EMR for this document.

## 2010-04-19 NOTE — Unmapped (Signed)
Signed by Red Christians Spreckelmeier MA on 07/08/2010 at 13:36:13    [facility]            REASON FOR VISIT  Chief Complaint: right orophuryngeal mass  Location where patient is seen: Clinic patient    ALLERGIES  ! * RASH  ! KEFLEX  ! AMITRIPTYLINE HCL (AMITRIPTYLINE HCL)  ! CEPHALOSPORINS  Allergy and adverse reaction list reviewed during this update.      MEDICATIONS   NEURONTIN 800 MG TABS (GABAPENTIN) 1 tab three times a day  MAG-OX 400  TABS (MAGNESIUM OXIDE TABS) QD  ORAZINC 220 MG CAPS (ZINC SULFATE) QD  OMEPRAZOLE  TBEC (OMEPRAZOLE TBEC) 50 mg qd  BYSTOLIC 20 MG TABS (NEBIVOLOL HCL)   OCUVITE  TABS (MULTIPLE VITAMINS-MINERALS) 1 tab daily  LYCOPENE  CAPS (LYCOPENE) 1 daily  DIOVAN HCT 320-12.5 MG TABS (VALSARTAN-HYDROCHLOROTHIAZIDE) 1 tab daily  KLOR-CON 10 10 MEQ CR-TABS (POTASSIUM CHLORIDE) 1 by mouth daily  LASIX 40 MG TABS (FUROSEMIDE) 1 by mouth daily  PERCOCET 5-325 MG TABS (OXYCODONE-ACETAMINOPHEN) by mouth every 4 hrs        VITAL SIGNS   Height: 68 inches0 kg.Temperature: 99.9  Pulse: 83  BP: 198/107  Cuff size: regular    PULSE OXIMETRY   O2 Saturation: 98 % w/ room air  Herbal Supplements: No  Aspirin Use: No    Intake recorded by: Red Christians Spreckelmeier MA on April 19, 2010 12:00 PM    Medications reviewed, updated and verified with patient or patient representative.    Screening for unhealthy alcohol use performed.    [             Vista Mink

## 2010-07-01 ENCOUNTER — Inpatient Hospital Stay

## 2010-07-01 NOTE — Unmapped (Signed)
Signed by Liborio Nixon MD on 07/01/2010 at 00:00:00  Operative Consent      Imported By: Coletta Memos 07/14/2010 10:51:50    _____________________________________________________________________    External Attachment:    Please see [facility] EMR for this document.

## 2010-07-01 NOTE — Unmapped (Signed)
Signed by Liborio Nixon MD on 07/01/2010 at 00:00:00  Orhto Surgery Checklist & Orders      Imported By: Coletta Memos 07/14/2010 10:52:13    _____________________________________________________________________    External Attachment:    Please see [facility] EMR for this document.

## 2010-07-01 NOTE — Unmapped (Signed)
Signed by Penni Bombard on 07/01/2010 at 13:32:52      Today's Orders   X-ray, Knee, 2 views [CPT-73560]

## 2010-07-01 NOTE — Unmapped (Signed)
Signed by Haywood Filler on 07/01/2010 at 13:27:56      Today's Orders   X-ray, Tibia & Fibula 2 views [[id number]]

## 2010-07-01 NOTE — Unmapped (Signed)
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  Phone Screen (no CPC visit) - H&P done by: Surgeon/Designee    Allergies:   ! * RASH  ! KEFLEX  ! AMITRIPTYLINE HCL (AMITRIPTYLINE HCL)  ! CEPHALOSPORINS  Latex Allergy: No    Anesthesia Requests: Other    Other Instructions: Page the Ortho Resident on Call the AM of Surgery.  Patient will arrive at 7:30 am for H&P    Type & Screen: No  Type & Cross: No    Day of Surgery Orders:   Hibiclens wipe to surgical site    Preoperative Antibiotic Prophylaxis Orders:     Antibiotic Prophylaxix not indicated:  Intra-operative cultures desired bebore antibiotics      Height: 68 inches.  Weight: 155 pounds.    Contraindication to Antibiotic Prophylaxis (Reason):   Dr. [name] will decide antibiotic after cultures    Physician: Dr. Casimiro Needle T. Tateanna Bach  Pager#: [phone]  LW ID#: [id number]

## 2010-07-01 NOTE — Unmapped (Signed)
Signed by Liborio Nixon MD on 07/05/2010 at 08:38:15  Patient: Arthur Neal  Note: All result statuses are Final unless otherwise noted.    Tests: (1)  (MR)    Order Note:                [facility]     PATIENT NAME:   Arthur Neal, Arthur Neal #:  [id number]  DATE OF BIRTH:  [birth year]                        LW MRN:  DICTATED BY:    Andee Lineman, M.D.          VISIT DATE:  07/01/2010                                          OFFICE NOTE     *-*-*  Arthur Neal is a patient of Dr. Atilano Ina. 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  He was last seen in February of 2011 by Dr.  Linna Darner.  He has had about a month of pain in his posterior calf and for about  a week he has had swelling and redness on the tip of his stump and that is  now broken down with drainage which is semi-purulent. It has been painful and  he is concerned about it and he is in the office today as Dr. Linna Darner is out  of town.     PHYSICAL EXAMINATION: The knee range is about 5 to about 95 degrees. No  varus/valgus instability. He has a region of erythema 3 cm in diameter at the  end of the residual limb with an open wound measuring 0.5 cm with slightly  purulent material draining out of it. I can't express anything out of it. He  is tender in his calf. His sensation is intact.     ASSESSMENT AND PLAN: I am going to set Arthur Neal up for an incision and  drainage. We have discussed this in detail. I told him we could treat it  conservatively with oral antibiotics and see how it goes. 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 I think it would be safest  to bring him in, do an incision and drainage, get deep cultures, get him on  parenteral antibiotics and then send him out on orals once we know what we  are  treating. He agrees. We discussed risks, benefits and alternatives of  procedure of operative versus nonoperative treatment. He was given the  opportunity to ask questions and I answered all of his questions. He signed  informed surgical consent and we will schedule him for surgery for tomorrow.  He will follow up with Dr. Linna Darner after surgery.  I will also obtain plain  films of the residual limb today in the office.     *-*-*  _______________________________________  MA/tmg                                 _____  D:  07/01/2010 T:  07/05/2010 05:15    Andee Lineman, M.D.  Job #:  [id number]                          OFFICE NOTE                    PAGE  1  of   1    Note: An exclamation mark (!) indicates a result that was not dispersed into   the flowsheet.  Document Creation Date: 07/05/2010 5:22 AM  _______________________________________________________________________    (1) Order result status: Final  Collection or observation date-time: 07/01/2010 00:00  Requested date-time:   Receipt date-time:   Reported date-time:   Referring Physician:    Ordering Physician:   (ARCHDMT)  Specimen Source:   Source: Marcy Siren Order Number: [id number]  Lab site:

## 2010-07-01 NOTE — Unmapped (Signed)
Signed by Reuben Likes on 07/12/2010 at 07:54:22    Surgery  / Procedure Schedule Sheet   Requested Date: 07/02/2010  Requested Time: 10:30 AM  Length of Surgery: 1.0 Hours  Form Completed By: Reuben Likes  Surgeon: Andee Lineman MD  Facility: [facility]  Type of Patient: Practice Patient  Surgery Type: Non Elective  Will patient go to: PACU  Is patient: Admit  Admit Date: 07/02/2010  # Nights: 1-2  Anesthesia Type: General  Post-Op Epidural: No    Allergies:   ! * RASH  ! KEFLEX  ! AMITRIPTYLINE HCL (AMITRIPTYLINE HCL)  ! CEPHALOSPORINS      Latex Sensitive: No  Patient will not need PAT appointment  Comments: Beverly Sessions Resident the AM of Surgery [phone], Ext. [phone]    Instrumentation: Ortho basic, pulsovac.      Procedure   Procedure: Right Incision and Drainage with Excisional Debridement of Below Knee Amputation Stump  Diagnoses: INFECTION OF AMPUTATION STUMP NEC (OZD-664.40)      Patient Information   Name: Arthur Neal  DOB: 04/01/1951  SSN: [id number]  Address: [address]  [place], [place]  [address]  Gender: Male  Home phone: [phone]  Work phone: [phone]  IDX #: [id number]  Last Word #: [id number]  PCP: Vista Mink    Insurance Information   Primary Insurance: [facility] C[facility]  Member ID #: [id number]  Phone#: [phone]    Billing Office   LOS: SDA  Contact Phone #: [phone]  Date: 07/01/2010  Other: Sherron Monday with Elizabeth @[employer] .  VOJ#[id number].  She transferred me for clinicals.  I left clinicals on the voicemail box for RN case reviewer.

## 2010-07-01 NOTE — Unmapped (Signed)
Signed by Liborio Nixon MD on 07/01/2010 at 13:32:47    [facility]      Reason for visit: Establish Care R BKA        Nursing Home: No  Need Physical Therapy Order: No  Needs Work/School Note: No    Medications on Intake   NEURONTIN 800 MG TABS (GABAPENTIN) 1 tab three times a day  MAG-OX 400  TABS (MAGNESIUM OXIDE TABS) QD  ORAZINC 220 MG CAPS (ZINC SULFATE) QD  OMEPRAZOLE  TBEC (OMEPRAZOLE TBEC) 50 mg qd  BYSTOLIC 20 MG TABS (NEBIVOLOL HCL)   OCUVITE  TABS (MULTIPLE VITAMINS-MINERALS) 1 tab daily  LYCOPENE  CAPS (LYCOPENE) 1 daily  DIOVAN HCT 320-12.5 MG TABS (VALSARTAN-HYDROCHLOROTHIAZIDE) 1 tab daily  KLOR-CON 10 10 MEQ CR-TABS (POTASSIUM CHLORIDE) 1 by mouth daily  LASIX 40 MG TABS (FUROSEMIDE) 1 by mouth daily  PERCOCET 5-325 MG TABS (OXYCODONE-ACETAMINOPHEN) by mouth every 4 hrs     Allergies  ! * RASH  ! KEFLEX  ! AMITRIPTYLINE HCL (AMITRIPTYLINE HCL)  ! CEPHALOSPORINS  Allergy and adverse reaction list reviewed during this update.      VITAL SIGNS   Height: 68 in.    Weight: 155 lbs.   BMI (in-lb): 23.65   Temperature: 98.6 degrees  F Respirations: 16   Pulse rate: 84     Intake recorded by: Haywood Filler  July 01, 2010 1:11 PM                 Medications reviewed, updated and verified with patient or patient representative.  Women (Any Age) or Men (over Age 22): less than 3 drinks per occasion                    See transcribed note for additional clinical documentation for this visit.    DISPOSITION   after surgery  - Follow up appointment    CC:   Arthur Neal

## 2010-07-01 NOTE — Unmapped (Signed)
Signed by Liborio Nixon MD on 07/01/2010 at 00:00:00  Insurance Correspondence      Imported By: Maryellen Pile 07/16/2010 16:40:56    _____________________________________________________________________    External Attachment:    Please see [facility] EMR for this document.

## 2010-07-01 NOTE — Unmapped (Signed)
Signed by   LinkLogic on 07/01/2010 at 14:56:02  Patient: Arthur Neal  Note: All result statuses are Final unless otherwise noted.    Tests: (1) DIAG-TIB/FIB MIN 2-VIEWS RT ([id number])    Order Note: [facility] Accession Number: [id number]    Order Note:                               [facility]     Patient: Arthur Neal, Arthur Neal   DOB:     09/20/1950   MRN:     [id number]   FIN:       [id number]   Accn#:   [id number]                                  Diagnostic Radiology     Exam                       Exam Date/Time       Ordering Physician   DIAG-TIB/FIB MIN 2-VIEWS   07/01/2010 14:02 EDT Elysse Polidore, Casimiro Needle T   RT     Reason for Exam   rt tib pain     Report   Two views of the right tibia/fibula     Indication: Tibial pain     Comparison: August 19, 2009     Findings: There is healed appearance of the proximal tibial fracture status   post ORIF with a lateral buttress plate and multiple screws. Redemonstrated   is an old healed fracture of the fibular head and increased   sclerosis/degenerative changes at the proximal tibiofibular joint. There   are few ossific fragments projecting at the fibular stump as before. There   has been a below-knee amputation.     Impression: No acute radiographic findings.   ********** VERIFIED REPORT **********     Dictated: 07/01/2010 2:47 pm      MEHTA MD, Colin Rhein   Signed (Electronic Signature):  MEHTA MD, Colin Rhein               07/01/10   2:53     Technologist: XTG    Order Note:   EMR Routing to: Andee Lineman T - ordering - [id number]    Note: An exclamation mark (!) indicates a result that was not dispersed into   the flowsheet.  Document Creation Date: 07/01/2010 2:56 PM  _______________________________________________________________________    (1) Order result status: Final  Collection or observation date-time: 07/01/2010 14:02:16  Requested date-time: 07/01/2010 13:56:00  Receipt date-time:   Reported date-time: 07/01/2010 14:53:13  Referring Physician: Casimiro Needle  Monya Kozakiewicz  Ordering Physician: Casimiro Needle Tascha Casares (ARCHDMT)  Specimen Source: RAD&Rad Type  Source: QRS  Filler Order Number: [id number]  Lab site: [facility]

## 2010-07-02 ENCOUNTER — Inpatient Hospital Stay

## 2010-07-02 NOTE — Unmapped (Signed)
Signed by   LinkLogic on 07/02/2010 at 12:07:05  Patient: Arthur Neal  Note: All result statuses are Final unless otherwise noted.    Tests: (1)  (MR)    Order Note:                                         [facility]     PATIENT NAME:   Arthur Neal                    MR #:  [id number]  DATE OF BIRTH:  1951-01-14                        ACCOUNT #:  [id number]  SURGEON:        Andee Lineman, M.D.          ROOM #:  PACU  SERVICE:        Surgery                           NURSING UNIT:  [facility]  PRIMARY:        Referring Nonstaff                FC:  C  REFERRING:      Andee Lineman, M.D.          ADMIT DATE:  07/02/2010  DICTATED BY:    Andee Lineman, M.D.          SURGERY DATE:  07/02/2010                                                    DISCHARGE DATE:                                    OPERATIVE REPORT     *-*-*     PREOPERATIVE DIAGNOSES:     1.  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  ICD-9 code 726.91.  2.  Cellulitis and abscess of leg except foot (right below-knee amputation  low-grade abscess over exostosis).  ICD-9 code 39.6.     POSTOPERATIVE DIAGNOSES:     1.  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  ICD-9 code 726.91.  2.  Cellulitis and abscess of leg except foot (right below-knee amputation  low-grade abscess over exostosis).  ICD-9 code 9.6.     PROCEDURE  PERFORMED:     1.  Partial excision (craterization, saucerization, and diaphysectomy) bone  (osteomyelitis or exostosis) tibia (right below-knee amputation stump  heterotopic ossification exostosis).  CPT code 54098.  2.  Incision and drainage, leg or ankle deep abscess or hematoma (right  residual limb below knee amputation stump abscess).  CPT code [phone].     SURGEON:   Inocente Salles. Archdeacon, M.D.     ASSISTANT:  Desmond Dike, M.D.     ANESTHESIA:  General via laryngeal mask airway.     ESTIMATED BLOOD LOSS:  Less than 50 mL.     TOURNIQUET TIME:  None.     COMPLICATIONS:  None.     BRIEF CLINICAL NOTE:  This is a 59 year old patient of my partner, Dr.  Linna Darner.  This patient had sustained significant right lower extremity trauma  resulting in a below-knee amputation.  He had been doing well.  Several weeks  prior, started having pain at the end of the residual limb.  He presented to  my office yesterday with an open wound draining with slight purulence and  erythema.  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  It was not certain if it was a deep abscess, osteomyelitis or pressure  phenomenon.  We decided to treat this aggressively with surgery including  incision and drainage and excision of bone.  Discussed risks, benefits, and  alternative procedures of operative versus nonoperative treatment.  I gave  him the opportunity to ask questions and I answered all of his questions.  He  signed informed surgical consent and was scheduled for surgery.     DETAILS OF PROCEDURE:  The patient was taken to the operating room and  general anesthesia was induced via laryngeal mask airway.  He received no  perioperative antibiotics.  The right lower extremity was prepped and draped  in a sterile fashion.  A formal timeout procedure was then performed  confirming the appropriate and correct right lower extremity and informed  consent document.     A 1 cm wound on the residual limb of his below-knee amputation stump was  excised in an elliptical manner with a transverse incision creating 3.5 cm in  length.  This tracked into the bursal area which appeared to be a low-grade  abscess or bursa directly over heterotopic bone exostosis.  The exostosis was  excised using a combination of rongeurs, curets and bone cutters.  Deep  cultures were obtained including biopsy of the  bone and soft tissues and sent  for definitive microbiology.  Wounds were copiously irrigated and closed in  layers; the deep layer with figure-of-eight 0 PDS sutures, subcutaneous  layers with inverted suture PDS suture and the skin was closed with 3-0 nylon  vertical mattress sutures.  Sterile compressive dressings were applied.  The  patient tolerated the procedure well.  He was taken to the PACU in stable  condition.  There were no complications.  He will be discharged on oral  antibiotics to follow with my partner, Dr. Linna Darner in 7-10 days.        *-*-*                                              _______________________________________  MA/kd                                  _____  D:  07/02/2010 10:14                   Andee Lineman, M.D.  T:  07/02/2010 12:03  Job #:  [id number]     c:   Anesthesia                                     OPERATIVE REPORT  PAGE    1 of   1    Note: An exclamation mark (!) indicates a result that was not dispersed into   the flowsheet.  Document Creation Date: 07/02/2010 12:07 PM  _______________________________________________________________________    (1) Order result status: Final  Collection or observation date-time: 07/02/2010 00:00  Requested date-time:   Receipt date-time:   Reported date-time:   Referring Physician: Andee Lineman  Ordering Physician:  Reviewed In [facility])  Specimen Source:   Source: DBS  Filler Order Number: [id number]  Lab site:

## 2010-07-02 NOTE — Unmapped (Signed)
Signed by   LinkLogic on 07/02/2010 at 07:23:40  Patient: Arthur Neal  Note: All result statuses are Final unless otherwise noted.    Tests: (1) AdmissionInformation (AI)    Account Number            [id number] (R)    Note: An exclamation mark (!) indicates a result that was not dispersed into   the flowsheet.  Document Creation Date: 07/02/2010 7:23 AM  _______________________________________________________________________    (1) Order result status: Final  Collection or observation date-time: 07/02/2010 07:20  Requested date-time:   Receipt date-time:   Reported date-time:   Referring Physician: Andee Lineman  Ordering Physician:  Reviewed In Hospital [facility])  Specimen Source:   Source: DBS  Filler Order Number:   Lab site: [facility]

## 2010-07-02 NOTE — Unmapped (Signed)
[facility]     PATIENT NAME:   Arthur Neal, Arthur Neal                    MR #:  [id number]   DATE OF BIRTH:  03-May-1951                        ACCOUNT #:  [id number]   SURGEON:        Andee Lineman, M.D.          ROOM #:  PACU   SERVICE:        Surgery                           NURSING UNIT:  [facility]   PRIMARY:        Referring Nonstaff                FC:  C   REFERRING:      Andee Lineman, M.D.          ADMIT DATE:  07/02/2010   DICTATED BY:    Andee Lineman, M.D.          SURGERY DATE:  07/02/2010                                                     DISCHARGE DATE:                                    OPERATIVE REPORT     *-*-*     PREOPERATIVE DIAGNOSES:     1.  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  ICD-9 code 726.91.   2.  Cellulitis and abscess of leg except foot (right below-knee amputation   low-grade abscess over exostosis).  ICD-9 code 51.6.     POSTOPERATIVE DIAGNOSES:     1.  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  ICD-9 code 726.91.   2.  Cellulitis and abscess of leg except foot (right below-knee amputation   low-grade abscess over exostosis).  ICD-9 code 66.6.     PROCEDURE  PERFORMED:     1.  Partial excision (craterization, saucerization, and diaphysectomy) bone   (osteomyelitis or exostosis) tibia (right below-knee amputation stump   heterotopic ossification exostosis).  CPT code 95638.   2.  Incision and drainage, leg or ankle deep abscess or hematoma (right   residual limb below knee amputation stump abscess).  CPT code [phone].     SURGEON:  Inocente Salles. Jamekia Gannett, M.D.     ASSISTANT:  Desmond Dike, M.D.     ANESTHESIA:  General via laryngeal mask airway.     ESTIMATED BLOOD LOSS:  Less than 50 mL.     TOURNIQUET TIME:  None.     COMPLICATIONS:  None.     BRIEF CLINICAL NOTE:  This is a 58 year old patient of my partner, Dr.   Linna Darner.  This patient had sustained significant right lower extremity trauma   resulting in a below-knee amputation.  He had been doing well.  Several weeks   prior, started having pain at the end of the residual limb.  He presented to   my office yesterday with an open wound draining with slight purulence and   erythema.  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   It was not certain if it was a deep abscess, osteomyelitis or pressure   phenomenon.  We decided to treat this aggressively with surgery including   incision and drainage and excision of bone.  Discussed risks, benefits, and   alternative procedures of operative versus nonoperative treatment.  I gave   him the opportunity to ask questions and I answered all of his questions.  He   signed informed surgical consent and was scheduled for surgery.     DETAILS OF PROCEDURE:  The patient was taken to the operating room and   general anesthesia was induced via laryngeal mask airway.  He received no   perioperative antibiotics.  The right lower extremity was prepped and draped   in a sterile fashion.  A formal timeout procedure was then performed   confirming the appropriate and correct right lower extremity and informed   consent document.     A 1 cm wound on the residual limb of his below-knee amputation stump was   excised in an elliptical manner with a transverse incision creating 3.5 cm in   length.  This tracked into the bursal area which appeared to be a low-grade   abscess or bursa directly over heterotopic bone exostosis.  The exostosis was   excised using a combination of rongeurs, curets and bone cutters.  Deep   cultures were obtained including biopsy of the bone and soft tissues and sent   for definitive microbiology.  Wounds were copiously irrigated and closed in   layers; the deep layer with  figure-of-eight 0 PDS sutures, subcutaneous   layers with inverted suture PDS suture and the skin was closed with 3-0 nylon   vertical mattress sutures.  Sterile compressive dressings were applied.  The   patient tolerated the procedure well.  He was taken to the PACU in stable   condition.  There were no complications.  He will be discharged on oral   antibiotics to follow with my partner, Dr. Linna Darner in 7-10 days.       *-*-*                                             _______________________________________   MA/kd                                  _____   D:  07/02/2010 10:14                   Andee Lineman, M.D.   T:  07/02/2010 12:03   Job #:  [id number]     c:   Anesthesia                                     OPERATIVE REPORT  PAGE    1 of   1   MA/kd                                  _____   D:  07/02/2010 10:14                   Andee Lineman, M.D.   T:  07/02/2010 12:03   Job #:  [id number]     c:   Anesthesia                                     OPERATIVE REPORT                                                                PAGE    1 of   1

## 2010-07-12 ENCOUNTER — Inpatient Hospital Stay

## 2010-07-12 NOTE — Unmapped (Signed)
Signed by Aubery Lapping MD on 07/19/2010 at 15:41:01  Patient: Arthur Neal  Note: All result statuses are Final unless otherwise noted.    Tests: (1)  (MR)    Order Note:                [facility]TS [facility]IC[facility]     PATIENT NAME:   Arthur Neal, Arthur Neal #:  [id number]  DATE OF BIRTH:  Mar 11, 1951                        LW MRN:  DICTATED BY:    Beulah Gandy. Andriy Sherk, M.D.              VISIT DATE:  07/12/2010                                          OFFICE NOTE     *-*-*  Arthur Neal saw Dr. [name] when I was out of town for an infection in his  right BKA. He had an I&D of the stump on 07/02/2010. This is his first  postop visit. His wound looks terrific. It is about a 3 cm wound on the end  of the stump. Sutures intact. Really no drainage at all. He has had his  prosthesis on, but really not wearing it too much. I am going to just leave  the sutures in for one more week. I will let him go ahead and start wearing  the prosthesis and just see how it feels. I will see him back in one week and  get his sutures out.     *-*-*                                              _______________________________________  JDW/mjf                                _____  D:  07/12/2010 T:  07/15/2010 15:01    Atharv Barriere D. Linna Darner, M.D.  Job #:  [id number]                          OFFICE NOTE                    PAGE  1  of   1    Note: An exclamation mark (!) indicates a result that was not dispersed into   the flowsheet.  Document Creation Date: 07/15/2010 3:05 PM  _______________________________________________________________________    (1) Order result status: Final  Collection or observation date-time: 07/12/2010 00:00  Requested date-time:   Receipt date-time:   Reported date-time:   Referring Physician:    Ordering Physician:   Lesleigh Noe)  Specimen Source:   Source: Marcy Siren Order Number: [id number]  Lab site:

## 2010-07-12 NOTE — Unmapped (Signed)
Signed by Aubery Lapping MD on 07/13/2010 at 08:11:46      Medications on Intake   NEURONTIN 800 MG TABS (GABAPENTIN) 1 tab three times a day  MAG-OX 400  TABS (MAGNESIUM OXIDE TABS) QD  ORAZINC 220 MG CAPS (ZINC SULFATE) QD  OMEPRAZOLE  TBEC (OMEPRAZOLE TBEC) 50 mg qd  BYSTOLIC 20 MG TABS (NEBIVOLOL HCL)   OCUVITE  TABS (MULTIPLE VITAMINS-MINERALS) 1 tab daily  LYCOPENE  CAPS (LYCOPENE) 1 daily  DIOVAN HCT 320-12.5 MG TABS (VALSARTAN-HYDROCHLOROTHIAZIDE) 1 tab daily  KLOR-CON 10 10 MEQ CR-TABS (POTASSIUM CHLORIDE) 1 by mouth daily  LASIX 40 MG TABS (FUROSEMIDE) 1 by mouth daily      Allergies  ! * RASH  ! KEFLEX  ! AMITRIPTYLINE HCL (AMITRIPTYLINE HCL)  ! CEPHALOSPORINS  Allergy and adverse reaction list reviewed during this update.      VITAL SIGNS   Height: 68 in.   Respirations: 18   Pulse rate: 72     Intake recorded by: Lorn Junes Syphax  July 12, 2010 5:35 PM                 Medications reviewed, updated and verified with patient or patient representative.    Screening for unhealthy alcohol use performed.  Men  (Age 46 or Under): nondrinker                  See transcribed note for additional clinical documentation for this visit.    DISPOSITION   Return to clinic in 1 week(s)

## 2010-07-19 ENCOUNTER — Inpatient Hospital Stay

## 2010-07-19 NOTE — Unmapped (Signed)
Signed by Aubery Lapping MD on 07/26/2010 at 16:02:01  Patient: Arthur Neal  Note: All result statuses are Final unless otherwise noted.    Tests: (1)  (MR)    Order Note:                [facility]IC[facility]     PATIENT NAME:   Arthur Neal, Arthur Neal #:  [id number]  DATE OF BIRTH:  05/28/1951                        LW MRN:  DICTATED BY:    Beulah Gandy. Aaditya Letizia, M.D.              VISIT DATE:  07/19/2010                                          OFFICE NOTE     *-*-*  Follow-up on his right BKA infection drainage.  His incision looks great.  The infection looks cleared.  We got his sutures out. He is going to go back  to his prosthesis and just give [place] a call if he has any problems. He has been  off of his antibiotics for three days.*-*-*                                              _______________________________________  JDW/fm                                 _____  D:  07/20/2010 T:  07/21/2010 21:12    Donni Oglesby D. Linna Darner, M.D.  Job #:  [id number]                          OFFICE NOTE                    PAGE  1  of   1    Note: An exclamation mark (!) indicates a result that was not dispersed into   the flowsheet.  Document Creation Date: 07/21/2010 9:14 PM  _______________________________________________________________________    (1) Order result status: Final  Collection or observation date-time: 07/19/2010 00:00  Requested date-time:   Receipt date-time:   Reported date-time:   Referring Physician:    Ordering Physician:   Lesleigh Noe)  Specimen Source:   Source: Marcy Siren Order Number: [id number]  Lab site:

## 2010-07-19 NOTE — Unmapped (Signed)
Signed by Aubery Lapping MD on 07/20/2010 at 08:04:57      Allergies  ! * RASH  ! KEFLEX  ! AMITRIPTYLINE HCL (AMITRIPTYLINE HCL)  ! CEPHALOSPORINS    VITAL SIGNS   Height: 68 in.                Previous Screening:   Screening for unhealthy alcohol use performed. (07/12/2010 5:34:40 PM)                  See transcribed note for additional clinical documentation for this visit.    DISPOSITION   prn

## 2011-01-28 NOTE — Unmapped (Signed)
Signed by Aubery Lapping MD on 01/28/2011 at 00:00:00  Office Visit      Imported By: Betsey Amen 03/15/2011 21:24:30    _____________________________________________________________________    External Attachment:    Please see [employer] EMR for this centepic_document_incr.

## 2011-02-17 NOTE — Unmapped (Signed)
Signed by Aubery Lapping MD on 02/17/2011 at 00:00:00  Precertification      Imported By: Coletta Memos 03/21/2011 21:47:11    _____________________________________________________________________    External Attachment:    Please see [employer] EMR for this centepic_document_incr.

## 2011-03-16 NOTE — Unmapped (Signed)
Name: Arthur Neal   DOB:  Dec 01, 1950   MRN:  [id number]    Indications: 60 y.o. male who had a CT pulmonary angiogram on  01/19/2009 showing a low density lesion in the pancreas. A follow-up  CT on 01/28/2009 showed a 9 mm low density lesion in the body of the  pancreatic body (duodenal diverticulum also noted) & 6 month f/u was  recommended. He was admitted to [facility],  [place], [place] on 11/02/2009 with chest/abdominal pain. MI and PE ruled  out. Abdominal CT showed a complex cystic and solid mass in the head  of the pancreas measuring 2.9 cm. A second 9 mm cyt was seen int he  body of the pancreas. The main duct was dilated at 5 mm. Inflammatory  changes were noted in the adipose between the pancreas and posterior  wall of the stomach. Mild gerneralized stranding was noted about the  pancreatic head. Serum lipase was elevated at 873 (nl 73-373) but  amylase was normal during hospitalization. EUS was requested for  further evaluation. Referring MD: Dr. Bari Edward.    History: Detailed clinical information is available in the recent  clinic/consult report.    Consent: The benefits, risks, and alternatives to the procedure were  discussed, and informed consent was obtained from the patient.    Preparation:    Medications:    Procedure: A linear echoendoscope w as advanced from the mouth to D2.  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    Findings:    EUS Findings: EUS imaging from the proximal stomach revealed moderate  edema of the posterior gastric wall which measured approximately 8.6  mm. A hypodense inhomogeneous lesion measuring 22 x 14 mm was seen in  the neck of the pancreas. Of note, this area was immediately  posterior the region of gastric wall edema. FNA x 4 passes was  performed. A cytotechnician was available to process the specimens. A  9.3. 11.4 round hypoechoic lesion with internal debris, possibly  representing a cyst, was also seen in the neck of the  pancreas  (medial the previously described hypodense lesion). A 25.9 x 20.7 mm  hypoechoic lesion was seen in the uncinate process, seen best from  D2. Three to four attempts were made to sample the lesion were using  a 25 gauge needle, however, a specimen could not be obtained due to  unstable scope position. Other parenchymal changes included  hyperechoic foci and strands in the pancreatic body. The remainder of  the head of pancreas appeared normal and no discrete cyst were seen  in the head. The main panreatic duct measured 2 mm in the body and  was normal in course and contour. The CBD and gallbladder appeared  normal. The left hepatic lobe appeared normal. No ascites. Vascular  structrues including PV, SMV, SMA, HA, and celiac axis were normal.    Estimated Blood Loss: None.    Intraprocedural Events: There were no unplanned events during the  procedure.    Post Proc Diagnostic Conclusion: Edema of the posterior gastric wall  with hypodense lesions in the neck and uncinate process of the  pancreas. FNA x 4 passes were performed from the pancreatic neck  lesion. Another 9 x 11 mm round hypodense was seen in the neck of the  pancreas, possible representing a cyst. No discrete cyst was seen in  the head of the pancreas. Overall, the findings are most compatible  with resolving pancreatitis but will await  cytology results.    Recommendations: - Await cytology - Continue pancreatic enzyme  supplements (currently on Zenpep) - Abstain from ETOH - Repeat CT  scan in 6 months or as clincally indicated    Post-Procedure Data:    Procedure Codes:43242    Performed By: The procedure was performed by Floreen Comber, MD.    Comments:  Version 1, electronically signed by Dr. Floreen Comber on 11/23/2009 at  15:52.

## 2011-03-23 ENCOUNTER — Ambulatory Visit: Admit: 2011-03-23 | Discharge: 2011-03-23 | Payer: PRIVATE HEALTH INSURANCE

## 2011-03-23 ENCOUNTER — Inpatient Hospital Stay

## 2011-03-23 DIAGNOSIS — S81009A Unspecified open wound, unspecified knee, initial encounter: Secondary | ICD-10-CM

## 2011-03-23 NOTE — Unmapped (Signed)
[facility]       PATIENT NAME:   Arthur Neal, Arthur Neal                    MR #:  [id number]   DATE OF BIRTH:  Oct 02, 1950                        ACCOUNT #:  [id number]   DICTATED BY:    Beulah Gandy. Akash Winski, M.D.              VISIT DATE:  03/23/2011                                         OFFICE NOTE       *-*-*   [name] had a fall on the right knee about a month or so ago and since that time   he has had skin breakdown over the right fibular head.  He is not sure if   there is just some swelling that has caused some change in the fitting of the   prosthesis but it does seem to be a pressure phenomenon over the fibula.  He   has a wound that is about 2 x 1 cm, full thickness over the fibular head.  No   cellulitis.  There is good granulation tissue there.  The rest of the stump   looks healthy. He has full range of motion.       I told him we are just going to watch it for now and have him use some type   of corn pad to keep the pressure off of it.  He is seeing Rocco tomorrow and   I told him he may be able to help [place] to take the pressure off as well but   that is going to be the key is to get the pressure off.  I told him to spend   some time out of his liner so he tries to dry it out some. I am just going to   see him back if he is still having problems in a couple of months. *-*-*                                                  Beulah Gandy. Linna Darner, M.D.   JDW/fm   D:  03/24/2011 10:55   T:  03/28/2011 23:55   Job #:  [id number]                                                 OFFICE NOTE  PAGE    1 of   1

## 2011-03-23 NOTE — Unmapped (Signed)
This office note has been dictated.

## 2011-04-06 ENCOUNTER — Ambulatory Visit: Payer: Legal Liability / Liability Insurance

## 2012-01-24 ENCOUNTER — Ambulatory Visit: Admit: 2012-01-24 | Discharge: 2012-01-24 | Payer: PRIVATE HEALTH INSURANCE

## 2012-01-24 DIAGNOSIS — M79609 Pain in unspecified limb: Secondary | ICD-10-CM

## 2012-01-24 NOTE — Unmapped (Signed)
This office note has been dictated.
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
## 2013-01-29 ENCOUNTER — Inpatient Hospital Stay: Admit: 2013-01-29 | Payer: PRIVATE HEALTH INSURANCE

## 2013-01-29 ENCOUNTER — Ambulatory Visit: Admit: 2013-01-29 | Discharge: 2013-01-29 | Payer: PRIVATE HEALTH INSURANCE

## 2013-01-29 DIAGNOSIS — S88119A Complete traumatic amputation at level between knee and ankle, unspecified lower leg, initial encounter: Secondary | ICD-10-CM

## 2013-01-29 DIAGNOSIS — M25559 Pain in unspecified hip: Secondary | ICD-10-CM

## 2013-01-29 NOTE — Unmapped (Signed)
This office note has been dictated.
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
## 2013-02-28 ENCOUNTER — Ambulatory Visit: Payer: PRIVATE HEALTH INSURANCE

## 2013-02-28 LAB — AFB CULTURE PLUS STAIN: Culture Result: NEGATIVE

## 2013-02-28 LAB — TISSUE CULTURE PLUS STAIN: Culture Result: NO GROWTH

## 2013-02-28 LAB — ANAEROBIC CULTURE

## 2013-02-28 LAB — ROUTINE CULTURE PLUS STAIN: Culture Result: NO GROWTH

## 2013-02-28 LAB — FUNGUS CULTURE

## 2013-02-28 MED ORDER — atropine injection
0.4 | INTRAMUSCULAR | Status: AC | PRN
Start: 2013-02-28 — End: 2013-02-28
  Administered 2013-02-28: 13:00:00 via INTRAVENOUS

## 2013-02-28 MED ORDER — fentaNYL (SUBLIMAZE) injection
50 | INTRAMUSCULAR | Status: AC | PRN
Start: 2013-02-28 — End: 2013-02-28
  Administered 2013-02-28 (×2): via INTRAVENOUS

## 2013-02-28 MED ORDER — midazolam (PF) (VERSED) injection
1 | INTRAMUSCULAR | Status: AC | PRN
Start: 2013-02-28 — End: 2013-02-28
  Administered 2013-02-28: 11:00:00 via INTRAVENOUS

## 2013-02-28 MED ORDER — lactated ringers infusion
INTRAVENOUS | Status: AC | PRN
Start: 2013-02-28 — End: 2013-02-28
  Administered 2013-02-28: 10:00:00 via INTRAVENOUS

## 2013-02-28 MED ORDER — lidocaine (PF) 20 mg/mL (2 %) Soln
20 | INTRAVENOUS | Status: AC | PRN
Start: 2013-02-28 — End: 2013-02-28
  Administered 2013-02-28: 12:00:00

## 2013-02-28 MED ORDER — ondansetron (ZOFRAN) 4 mg/2 mL injection
4 | INTRAMUSCULAR | Status: AC | PRN
Start: 2013-02-28 — End: 2013-02-28
  Administered 2013-02-28: 13:00:00 via INTRAVENOUS

## 2013-02-28 MED ORDER — sodium chloride 0.9 % irrigation
0.9 | Status: AC | PRN
Start: 2013-02-28 — End: 2013-02-28
  Administered 2013-02-28: 12:00:00

## 2013-02-28 MED ORDER — fentaNYL (SUBLIMAZE) injection 50 mcg
50 | INTRAMUSCULAR | Status: AC | PRN
Start: 2013-02-28 — End: 2013-02-28

## 2013-02-28 MED ORDER — acetaminophen (OFIRMEV) Soln
1000 | INTRAVENOUS | Status: AC | PRN
Start: 2013-02-28 — End: 2013-02-28
  Administered 2013-02-28: 12:00:00 via INTRAVENOUS

## 2013-02-28 MED ORDER — HYDROmorphone (DILAUDID) injection Syrg 0.2 mg
1 | INTRAMUSCULAR | Status: AC | PRN
Start: 2013-02-28 — End: 2013-02-28

## 2013-02-28 MED ORDER — oxyCODONE-acetaminophen (PERCOCET) 5-325 mg per tablet
5-325 | ORAL_TABLET | ORAL | Status: AC | PRN
Start: 2013-02-28 — End: 2013-05-13

## 2013-02-28 MED ORDER — nalOXone (NARCAN) injection 0.04 mg
0.4 | INTRAMUSCULAR | Status: AC | PRN
Start: 2013-02-28 — End: 2013-02-28

## 2013-02-28 MED ORDER — lactated ringers infusion
INTRAVENOUS | Status: AC
Start: 2013-02-28 — End: 2013-02-28

## 2013-02-28 MED ORDER — oxyCODONE-acetaminophen (PERCOCET) 5-325 mg per tablet 2 tablet
5-325 | Freq: Once | ORAL | Status: AC
Start: 2013-02-28 — End: 2013-02-28

## 2013-02-28 MED ORDER — bacitracin-polymyxin b (POLYSPORIN) ointment (bulk)
500-10000 | TOPICAL | Status: AC | PRN
Start: 2013-02-28 — End: 2013-02-28
  Administered 2013-02-28: 12:00:00 via TOPICAL

## 2013-02-28 MED ORDER — HYDROmorphone (DILAUDID) injection Syrg 0.6 mg
1 | INTRAMUSCULAR | Status: AC | PRN
Start: 2013-02-28 — End: 2013-02-28

## 2013-02-28 MED ORDER — clindamycin in dextrose 5% (CLEOCIN) 600 mg/50 mL IVPB 600 mg
600 | INTRAVENOUS | Status: AC | PRN
Start: 2013-02-28 — End: 2013-02-28
  Administered 2013-02-28: 12:00:00 via INTRAVENOUS

## 2013-02-28 MED ORDER — fentaNYL (SUBLIMAZE) injection 12.5 mcg
50 | INTRAMUSCULAR | Status: AC | PRN
Start: 2013-02-28 — End: 2013-02-28

## 2013-02-28 MED ORDER — neostigmine (PROSTIGMINE) injection
1 | INTRAMUSCULAR | Status: AC | PRN
Start: 2013-02-28 — End: 2013-02-28
  Administered 2013-02-28: 13:00:00 via INTRAVENOUS

## 2013-02-28 MED ORDER — rocuronium (ZEMURON) injection
10 | INTRAVENOUS | Status: AC | PRN
Start: 2013-02-28 — End: 2013-02-28
  Administered 2013-02-28: 12:00:00 via INTRAVENOUS

## 2013-02-28 MED ORDER — promethazine (PHENERGAN) injection 6.25 mg
25 | Freq: Four times a day (QID) | INTRAMUSCULAR | Status: AC | PRN
Start: 2013-02-28 — End: 2013-02-28

## 2013-02-28 MED ORDER — HYDROmorphone (DILAUDID) injection Syrg 0.4 mg
1 | INTRAMUSCULAR | Status: AC | PRN
Start: 2013-02-28 — End: 2013-02-28

## 2013-02-28 MED ORDER — propofol 10 mg/ml (DIPRIVAN) injection
10 | INTRAVENOUS | Status: AC | PRN
Start: 2013-02-28 — End: 2013-02-28
  Administered 2013-02-28 (×2): via INTRAVENOUS

## 2013-02-28 MED ORDER — fentaNYL (SUBLIMAZE) injection 25 mcg
50 | INTRAMUSCULAR | Status: AC | PRN
Start: 2013-02-28 — End: 2013-02-28

## 2013-02-28 MED ORDER — ondansetron (ZOFRAN) 4 mg/2 mL injection 4 mg
4 | Freq: Three times a day (TID) | INTRAMUSCULAR | Status: AC | PRN
Start: 2013-02-28 — End: 2013-02-28

## 2013-02-28 MED FILL — CLEOCIN 600 MG/50 ML IN 5 % DEXTROSE INTRAVENOUS PIGGYBACK: 600 600 mg/50 mL | INTRAVENOUS | Qty: 50

## 2013-02-28 NOTE — Unmapped (Addendum)
Leave dressing in place for 3 days. Reinforce as need. Then ok to remove and get wet in shower. Dry completely and cover with gause. Regular diet. Weight bear as tolearted            Instructions Following General Anesthetic, Adult  A nurse specialized in giving anesthesia (anesthetist) or a doctor specialized in giving anesthesia (anesthesiologist) gave you a medicine that made you sleep while a procedure was performed. For as long as 24 hours following this procedure, you may feel:  ?? Dizzy.  ?? Weak.  ?? Drowsy.  AFTER THE PROCEDURE  After surgery, you will be taken to the recovery area where a nurse will monitor your progress. You will be allowed to go home when you are awake, stable, taking fluids well, and without complications.  For the first 24 hours following an anesthetic:  ?? Have a responsible person with you.  ?? Do not drive a car. If you are alone, do not take public transportation.  ?? Do not drink alcohol.  ?? Do not take medicine that has not been prescribed by your caregiver.  ?? Do not sign important papers or make important decisions.  ?? You may resume normal diet and activities as directed.  ?? Change bandages (dressings) as directed.  ?? Only take over-the-counter or prescription medicines for pain, discomfort, or fever as directed by your caregiver.  If you have questions or problems that seem related to the anesthetic, call the hospital and ask for the anesthetist or anesthesiologist on call.  SEEK IMMEDIATE MEDICAL CARE IF:   ?? You develop a rash.  ?? You have difficulty breathing.  ?? You have chest pain.  ?? You develop any allergic problems.  Document Released: 11/21/2000 Document Revised: 11/07/2011 Document Reviewed: 07/02/2007  ExitCare?? Patient Information ??2014 [place], [place].

## 2013-02-28 NOTE — Unmapped (Signed)
Anesthesia Transfer of Care Note    Patient: Arthur Neal  Procedure(s) Performed: Procedure(s):  REMOVAL HARDWARE (PLATE ADN SCREWS) RIGHT FEMUR, EXCISION EXOSTOSIS    Patient location: PACU    Post pain: Adequate analgesia    Post assessment: no apparent anesthetic complications, tolerated procedure well and no evidence of recall    Post vital signs:    Filed Vitals:    02/28/13 0937   BP: 129/69   Pulse: 62   Temp:    Resp: 10   SpO2: 100%       Level of consciousness: awake, alert  and oriented    Complications: None

## 2013-02-28 NOTE — Unmapped (Signed)
Report given to [facility] RN, SDS; transportation pending

## 2013-02-28 NOTE — Unmapped (Signed)
1125 pt & escort verbalize understanding D/C instructions & 1 prescription given.  Pt refuses dose of pain med before D/C.

## 2013-02-28 NOTE — Unmapped (Signed)
Anesthesia Post Note    Patient: Arthur Neal    Procedure(s) Performed: Procedure(s):  REMOVAL HARDWARE (PLATE ADN SCREWS) RIGHT FEMUR, EXCISION EXOSTOSIS    Anesthesia type: general    Patient location: PACU    Post pain: Adequate analgesia    Post assessment: no apparent anesthetic complications    Last Vitals:   Filed Vitals:    02/28/13 1007   BP: 144/86   Pulse: 83   Temp: 98.7 ??F (37.1 ??C)   Resp: 11   SpO2:        Post vital signs: stable    Level of consciousness: awake, alert  and oriented    Complications: None

## 2013-02-28 NOTE — Unmapped (Signed)
Anesthesia Extubation Criteria:    Emergence Details:      Smooth      _x_      Stormy       __       Prolonged   __     Extubation Criteria:      Motor strength intact       _x_      Follows commands         _x_      Good airway reflexes      _x_      OP suctioned                  _x_      ETT      follows commands:  Yes     Patient extubated:  Yes

## 2013-02-28 NOTE — Unmapped (Signed)
Pt admitted from OR to PACU per protocol; medicated per anesthesia for pain        1007     Pt met discharge criteria

## 2013-02-28 NOTE — Unmapped (Signed)
REMOVAL HARDWARE (PLATE ADN SCREWS) RIGHT FEMUR, EXCISION EXOSTOSIS  Procedure Note    Arthur Neal  02/28/2013      Pre-op Diagnosis: PAINFUL HARDWARE RIGHT FEMUR AND EXOSTOSIS       Post-op Diagnosis: same      Procedure(s):  REMOVAL HARDWARE (PLATE ADN SCREWS) RIGHT FEMUR, EXCISION EXOSTOSIS      Surgeon(s):  Marylyn Ishihara, MD    Anesthesia: General    Staff:   Circulator: Julaine Fusi, RN  Scrub Person: Josh Fiscus, CST; Anola Gurney, CST  Resident: Karna Dupes, MD; Pricilla Holm, MD  Preop Nurse: Nelwyn Salisbury, RN    Estimated Blood Loss: less than 100 mL                 Specimens:   ID Type Source Tests Collected by Time Destination   1 : 1. culture swab-right proximal femur Swab Leg Right ANAEROBIC CULTURE, FUNGUS CULTURE, ROUTINE CULTURE PLUS STAIN Marylyn Ishihara, MD 02/28/2013  8:27 AM    2 : 2. tissue culture-right proximal femur Tissue Leg Right ANAEROBIC CULTURE, FUNGUS CULTURE, TISSUE CULTURE PLUS STAIN, AFB CULTURE PLUS STAIN Marylyn Ishihara, MD 02/28/2013  8:29 AM               Drains:             There were no complications unless listed below.        [name]     Date: 02/28/2013  Time: 9:19 AM
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
## 2013-02-28 NOTE — Unmapped (Signed)
1107 pt states feeling ready to go home.  Denies nausea & dizziness, states only slight discomfort in surgical area.

## 2013-03-11 ENCOUNTER — Inpatient Hospital Stay: Admit: 2013-03-11 | Payer: PRIVATE HEALTH INSURANCE

## 2013-03-11 ENCOUNTER — Ambulatory Visit: Admit: 2013-03-11 | Discharge: 2013-03-11 | Payer: PRIVATE HEALTH INSURANCE

## 2013-03-11 DIAGNOSIS — Z472 Encounter for removal of internal fixation device: Secondary | ICD-10-CM

## 2013-03-11 DIAGNOSIS — M25559 Pain in unspecified hip: Secondary | ICD-10-CM

## 2013-03-11 NOTE — Unmapped (Signed)
This office note has been dictated.

## 2013-03-11 NOTE — Unmapped (Signed)
Staples were removed on pts right hip per the doctor's instruction.   Patient tolerated this well with no complications.   Steri strips were applied.   Patient was given instructions regarding showering/bathing.

## 2013-03-12 NOTE — Unmapped (Signed)
[facility]  AND SPORTS MEDICINE     PATIENT NAME:  Arthur Neal, Arthur Neal                MRN:  [id number]  DATE OF BIRTH:  October 21, 1950                   CSN:  [id number]  DICTATED BY:   Beulah Gandy. Aleigha Gilani, M.D.          VISIT DATE:  03/11/2013                                       OFFICE NOTE     [name] is seen in follow-up after removal of hardware from the right hip  and excision of some exostoses. The surgery was 02/28/2013.  He states that he  was feeling terrific. He could tell immediately after the surgery that he  felt better.  Unfortunately, he thinks he overdid it over the weekend.  He  was moving some furniture around and feels like he may have pulled a muscle  in his groin but otherwise he is doing very well.     His incision looks fine.  We got his staples out.     X-rays show him status post his plate removal of the proximal femur and the  exostoses are gone as well.     I am just going to release him to activity as tolerated and just recheck him  maybe in a couple of months.  No x-rays needed.                                             Beulah Gandy. Linna Darner, M.D.  JDW/tlm  D:  03/12/2013 08:02  T:  03/14/2013 21:19  Job #:  [id number]                                                      OFFICE NOTE                                                               PAGE    1 of   1

## 2013-05-13 ENCOUNTER — Ambulatory Visit: Admit: 2013-05-13 | Discharge: 2013-05-13 | Payer: PRIVATE HEALTH INSURANCE

## 2013-05-13 DIAGNOSIS — T859XXA Unspecified complication of internal prosthetic device, implant and graft, initial encounter: Secondary | ICD-10-CM

## 2013-05-13 NOTE — Unmapped (Signed)
This office note has been dictated.

## 2013-05-15 NOTE — Unmapped (Signed)
[facility]  AND SPORTS MEDICINE     PATIENT NAME:  ARLEY, SALAMONE                MRN:  [id number]  DATE OF BIRTH:  Dec 26, 1950                   CSN:  [id number]  DICTATED BY:   Virl Axe, P.A.            VISIT DATE:  05/13/2013                                       OFFICE NOTE     HISTORY OF PRESENT ILLNESS:  Mr. Gorelik is seen today for follow-up after  removal of hardware from the right hip and excision of some ____.  The  surgery was on 02/28/13.  He was feeling better for a short time after the  surgery; however, now the pain has begun to increase back to how it was prior  to surgery.  The pain has been going on for about the past month.  He states  that it is worse when he is just standing still.  Nothing seems to elicit it  or make it feel better.  He is a fairly active individual.     He recently moved and was moving a lot of heavier furniture.  States that the  pain has not gotten any worse or better over the past month.  He denies any  numbness or tingling.  The pain is primarily over his lateral aspect of his  hip down into the lower buttock region.  The pain does not radiate down  through to his stump.     PHYSICAL EXAMINATION:  He is in no acute distress.  He is mildly tender to  palpation over the greater troch.  He has a negative logroll.  His incision  is well healed at this point without any signs of infection.  No imaging as  done today.     The patient was seen with Dr. Linna Darner and he was present for the physical  exam, assessment and plan portion of the visit.     ASSESSMENT/PLAN:  The patient is advised to follow-up in six weeks if the  pain gets worse or does not seem to get any better.  The patient and his wife  will be [place] to the [place] the winter season starting in November.  If  he is still having pain and not feeling better prior to leaving he is to come  follow back up with Dr. Linna Darner.  He can get repeat  x-rays at this time.                                             Virl Axe, P.A.  MB/smj  D:  05/15/2013 11:54  T:  05/18/2013 10:15  Job #:  [id number]  OFFICE NOTE                                                               PAGE    1 of   1

## 2013-06-18 ENCOUNTER — Ambulatory Visit: Admit: 2013-06-18 | Discharge: 2013-06-18 | Payer: PRIVATE HEALTH INSURANCE

## 2013-06-18 ENCOUNTER — Inpatient Hospital Stay: Admit: 2013-06-18 | Payer: PRIVATE HEALTH INSURANCE

## 2013-06-18 DIAGNOSIS — R52 Pain, unspecified: Secondary | ICD-10-CM

## 2013-06-18 DIAGNOSIS — T85848A Pain due to other internal prosthetic devices, implants and grafts, initial encounter: Secondary | ICD-10-CM

## 2013-06-18 DIAGNOSIS — Z472 Encounter for removal of internal fixation device: Secondary | ICD-10-CM

## 2013-06-18 DIAGNOSIS — M545 Low back pain, unspecified: Secondary | ICD-10-CM

## 2013-06-18 NOTE — Unmapped (Signed)
This office note has been dictated.

## 2013-06-19 NOTE — Unmapped (Signed)
[facility]  AND SPORTS MEDICINE     PATIENT NAME:  Arthur Neal, Arthur Neal                MRN:  [id number]  DATE OF BIRTH:  26-May-1951                   CSN:  [id number]  DICTATED BY:   Beulah Gandy. Marbin Olshefski, M.D.          VISIT DATE:  06/18/2013                                       OFFICE NOTE     Raiford is seen in followup on his right hip.  He states he is still having pain  in the posterior aspect and his buttocks.  He still recalls that he felt  terrific for a few days after getting the hardware out but now it is  basically back.  His pain is posteriorly and he has some tenderness along the  sciatic nerve and the notch; otherwise, he has good motion in the hip.  He  has a negative straight leg raise.     His x-rays today show the right femur status post proximal plate removal. His  hip looks fine.     He does have a history of spinal surgery and has a spinal implant in.  I  talked to Dr. Sandi Mealy and I am going to have Dr. Sandi Mealy see him this  afternoon to see if his back maybe the origin of his hip pain.  I will see  him back as needed.                                             Beulah Gandy. Linna Darner, M.D.  JDW/tlm  D:  06/19/2013 14:14  T:  06/22/2013 12:38  Job #:  [id number]                                                      OFFICE NOTE                                                               PAGE    1 of   1

## 2013-06-19 NOTE — Unmapped (Signed)
[facility]  AND SPORTS MEDICINE     PATIENT NAME:  Arthur Neal, SIEDSCHLAG                MRN:  [id number]  DATE OF BIRTH:  [birth year]                   CSN:  [id number]  DICTATED BY:   Beulah Gandy. Ashten Prats, M.D.          VISIT DATE:  06/18/2013                                       OFFICE NOTE     ADDENDUM - 06/22/13 - smj     I have requested a consult with Dr. Sandi Mealy regarding his lumbosacral  contributions to his hip pain.                                             Beulah Gandy. Linna Darner, M.D.  JDW/smj  D:  06/19/2013 14:16  T:  06/22/2013 12:54  Job #:  [id number]                                                      OFFICE NOTE                                                               PAGE    1 of   1
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
## 2013-06-20 ENCOUNTER — Ambulatory Visit: Admit: 2013-06-20 | Payer: PRIVATE HEALTH INSURANCE

## 2013-06-20 DIAGNOSIS — G8929 Other chronic pain: Secondary | ICD-10-CM
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
## 2013-06-20 NOTE — Unmapped (Signed)
VITAL SIGNS (Pre-Procedure)   Blood Pressure: 141/89 mm Hg,     Respirations: 15,     Pulse rate: 67,     O2 Saturation:98%,  Current Pain Level: 5, (Pain level is assessed on a 10 point scale).    Latex Allergy: No  Allergic to Betadine: no  Allergies to Drugs or Medications: No  See chart   Hospitalization within last two weeks: No  Blood thinners prescribed: No  Problem with passing out related to a procedure/ surgery: No  Diagnosed with MRSA of body or spine: No  Been diagnosed with cancer: No  Treated with chemotherapy or radiation: No  Problem with platelets: No  Bleeding problems/ blood disorders: No  Problems with liver including hepatitis or cirrhosis? No  Currently receiving kidney dialysis treatment: No  Recent cough, colds, infections or antibiotics? No  Pacemaker: No  Defibrillator: No  Ever had an organ transplant: No  Diabetic: No  Pregnant or may be pregnant: No  Previous back/ neck surgery: Yes 2003  Numbness in legs: No  Tingling in legs: No  Weakness in legs: No  Increased pain: Yes  Low Back Pain: Yes  Thigh Pain:  Yes  Calf Pain: Yes  Time out at  10.30  Correct Procedure: yes  Correct Site: yes  Team in agreement: yes    PROCEDURE PERFORMED: right  SI   SA S1,2     Attending Surgeon: Chapman Fitch, M.D.  Surgeon: Chapman Fitch, M.D.  Assistant Surgeon:   Anesthesia: Local  Specimen Removed: None  Complications: None    PROCEDURE IN DETAIL:   PREOPERATIVE DIAGNOSIS:    Right SI Joint Arthropathy        Lumbar Spondylosis    POSTOPERATIVE DIAGNOSIS:  Same.     PROCEDURE: Right sacroiliac joint steroid injection and Right Sacroala, S1 and S2 LBB under fluoroscopic guidance.    PROCEDURE IN DETAIL:  The patient was placed in a prone position and the lower back was prepped with chloraprep and draped in the usual sterile fashion.  The skin overlaying the right SI joint was infiltrated with 1% lidocaine for local anesthesia.  A 22-gauge 3.5 inch spinal needle was inserted through the skin under  fluoroscopic guidance until we got to the lower third of the SI joint. 22 g spinal needle was used to find the right sacroala, S1 and S2 lateral branch under fluoroscopic guidance. 2 cc of 0.25% marcaine with kenalog was injected at each level. A total of 8 cc of 0.25% marcaine with 80 mg of kenalog was injected.     After the procedure the needles were flushed with preservative free local anesthetic and removed. Skin was cleaned and a sterile dressing was applied.    Following the procedure the patient's vital signs were stable. The patient was discharged home in good condition after being given discharge instructions.    COMPLICATIONS: None'   VITAL SIGNS (Post-Procedure)     Blood Pressure: 155/80 mm Hg,     Respirations: 14,     Pulse rate: 92  ,   O2 Saturation: 100%,     Current Pain Level: 4, (Pain level is assessed on a 10 point scale).    Patient/ caregiver verbalized understanding? yes  Complications during stay? no    Fluoro Time: .18    The patient was given written discharge instructions.    Discharged by: mr  Time: 10.55

## 2013-06-24 DIAGNOSIS — G8929 Other chronic pain: Secondary | ICD-10-CM
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
## 2013-06-25 NOTE — Unmapped (Signed)
Pt will be scheduled  for Ct myelogram by Guss Bunde [phone]. She will call pt . No auth req. VM

## 2013-06-27 ENCOUNTER — Encounter

## 2013-06-27 NOTE — Unmapped (Signed)
Called patient to see how he is feeling after his Injection. Left V-mail.

## 2013-08-13 ENCOUNTER — Inpatient Hospital Stay: Payer: PRIVATE HEALTH INSURANCE

## 2013-08-13 ENCOUNTER — Inpatient Hospital Stay: Admit: 2013-08-13 | Payer: PRIVATE HEALTH INSURANCE

## 2013-08-13 DIAGNOSIS — M48061 Spinal stenosis, lumbar region without neurogenic claudication: Secondary | ICD-10-CM

## 2013-08-13 MED ORDER — methylPREDNISolone (MEDROL) 32 MG tablet
32 | ORAL_TABLET | ORAL | Status: AC
Start: 2013-08-13 — End: 2013-08-15

## 2013-08-13 NOTE — Unmapped (Signed)
CT myelogram rescheduled due to no premedication for iodine allergy.    Patient now scheduled for Thursday 08/15/13 at 8am.  Patient needs pretreatment prescription called to [facility].    Message left with Francesca Oman at Dr. Andria Rhein office. 12:15pm today regarding need for script.

## 2013-08-13 NOTE — Unmapped (Signed)
CT myelogram rescheduled due to no premedication for iodine allergy.  Please advise.

## 2013-08-13 NOTE — Unmapped (Signed)
Pt has been notified to pick up script at [facility]

## 2013-08-13 NOTE — Unmapped (Signed)
He will need premedication with medrol before procedure, will need rescheduled. I will put meds in.

## 2013-08-15 ENCOUNTER — Inpatient Hospital Stay: Admit: 2013-08-15 | Payer: PRIVATE HEALTH INSURANCE

## 2013-08-15 DIAGNOSIS — M5137 Other intervertebral disc degeneration, lumbosacral region: Secondary | ICD-10-CM

## 2013-08-15 DIAGNOSIS — M48061 Spinal stenosis, lumbar region without neurogenic claudication: Secondary | ICD-10-CM

## 2013-08-15 MED ORDER — OMNIPAQUE (iohexol) 240 mg iodine/mL 20 mL
240 | Freq: Once | INTRAVENOUS | Status: AC | PRN
Start: 2013-08-15 — End: 2013-08-15
  Administered 2013-08-15: 14:00:00 via INTRATHECAL

## 2013-08-15 MED FILL — OMNIPAQUE 240 MG IODINE/ML INTRAVENOUS SOLUTION: 240 240 mg iodine/mL | INTRAVENOUS | Qty: 20

## 2013-08-15 NOTE — Unmapped (Signed)
Pt will need to have treatment for Iodine allergy prior to myelogram. Dr.Yates cx first one due to this allergy. He gave pt directions for treatment.

## 2014-01-01 ENCOUNTER — Ambulatory Visit: Admit: 2014-01-01 | Discharge: 2014-01-01 | Payer: PRIVATE HEALTH INSURANCE

## 2014-01-01 DIAGNOSIS — M545 Low back pain, unspecified: Secondary | ICD-10-CM

## 2014-01-01 NOTE — Unmapped (Signed)
This office note has been dictated.

## 2014-01-06 NOTE — Unmapped (Signed)
[facility]  AND SPORTS MEDICINE     PATIENT NAME:  EATHON, VALADE                MRN:  [id number]  DATE OF BIRTH:  12-07-1950                   CSN:  [id number]  PROVIDER:   Cephus Shelling, M.D.             VISIT DATE:  01/01/2014                                       OFFICE NOTE     Mr. Mccravy is here for follow-up of his low back and right lower extremity  pain. He had a previous L5-S1 fusion many years ago. He has developed  degenerative spondy at L4-5 with severe stenosis at this level. He had a CT  myelogram done.  He had a spinal cord stimulator put in a while ago so he  cannot get an MRI.  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  He has had multiple right lower  extremity procedures done by Dr. Linna Darner in the past. He does have an  amputation and wears a prosthetic leg.     ASSESSMENT AND PLAN: I had  a long discussion today with him regarding his  options. He has had epidurals without relief and physical therapy without  relief.  Surgery is an option for him which would involve decompression  fusion and instrumentation at L4-5. I told him to think about things and let  me know how he wants to go forward. We can always try more injections but in  the past they have not helped him.                                                Cephus Shelling, M.D.  SA/tmg  D:  01/06/2014 14:32  T:  01/09/2014 06:34  Job #:  [id number]                                                      OFFICE NOTE                                                               PAGE    1 of   1

## 2014-01-14 ENCOUNTER — Encounter

## 2014-01-24 ENCOUNTER — Ambulatory Visit: Payer: PRIVATE HEALTH INSURANCE

## 2014-01-30 ENCOUNTER — Ambulatory Visit: Payer: PRIVATE HEALTH INSURANCE

## 2014-01-30 NOTE — Unmapped (Addendum)
Pre-Procedure Instructions    We???re pleased that you have chosen [facility] for your upcoming procedure.  The staff serving you is professionally trained to provide the highest quality care.  We encourage you to ask questions and to let the staff know your special needs.  We want your visit to be as comfortable as possible.    Your procedure is scheduled on 02/03/14 at 1130 AM  Please arrive at 0930 AM  and check in at the surgery waiting room on the second floor of the main hospital.    DIRECTION:  1) Main entrance to the elevators on the left.  2) Elevator to the second floor.  3) Left off elevator to the Surgical Services Desk.      DO NOT EAT OR DRINK ANYTHING (including gum, mints, water, etc.) after midnight the night before your procedure.  You may brush your teeth and gargle on the morning of surgery, but do not swallow any water with the exception of the following medication: protonix (with a small sip of water).    FOLLOW YOUR PHYSICIAN'S INSTRUCTIONS CONCERNING DISCONTINUING ASPIRIN, IBUPROFEN, NSAIDS, SUPPLEMENTS, FISH OIL, VITAMINS, AND HERBAL SUPPLEMENTS.    Please make transportation arrangements and bring a responsible adult to accompany you home and remain with you for 24 hours.    Leave valuables (money, jewelry, credit cards) at home.  If you wear glasses or contacts, bring a case for safekeeping.    Wear casual, loose fitting, and comfortable clothing.  A gown will be provided.  If you are staying overnight, bring a small overnight bag.  (Storage space is limited.)    Please remove all makeup, jewelry, body piercings, powder, perfume, and nail polish before you arrive.    Bring a list of your medications and dose including herbal.  Do not bring any pills or medications to the hospital. (Exception: transplant patients.)    Bring a photo ID and your insurance card so we can bill your insurance company directly.    Please do not bring any children under the age of 25 to the hospital.    Do  not shave in the area of the surgery for 2 days prior to surgery.  If needed, a trained staff member will clip the area immediately before your surgery.    Quit smoking as far in advance of surgery as possible.  Patients who quit at least 30 days before surgery may have better outcomes.    If you are diabetic, pay close attention to your blood sugar and try to keep it in the range your doctor wants it to be in.      Discuss discontinuing herbal medications with your doctor before surgery.    Talk to your doctor about taking medication such as Aspirin, Plavix, Pradaxa or Coumadin before surgery.    Please shower at home the evening before and the morning of surgery using an antibacterial soap.    If you have a cold or are sick prior to surgery, contact your doctor before surgery.    Additional instructions:    Patient/Family provided education about surgical site infection prevention.  Antibacterial showering and good hand hygiene are essential to prevent surgical site infections and reduce the spread of MRSA.  Patient verbalized understanding of these instructions.    Make sure all of your health care givers are checking your ID bracelet and verifying your name and date of birth.  You will actively be involved in verifying the type of surgery you  are having and the correct site.  Your health care givers should be cleaning their hands with soap and water or antibacterial foam before taking care of you and if they do not it is ok to remind them to do so.      Patient verbalized understanding of instructions and education.      Contact information:    [facility] Pre-admission Testing, Monday - Friday 8:00 am - 4:30 pm, [phone].

## 2014-02-03 ENCOUNTER — Ambulatory Visit: Admit: 2014-02-03 | Payer: PRIVATE HEALTH INSURANCE

## 2014-02-03 ENCOUNTER — Ambulatory Visit: Admit: 2014-02-03

## 2014-02-03 ENCOUNTER — Inpatient Hospital Stay
Admit: 2014-02-03 | Discharge: 2014-02-05 | Disposition: A | Discharge status: Home / Self Care | Payer: PRIVATE HEALTH INSURANCE | Source: Ambulatory Visit

## 2014-02-03 ENCOUNTER — Inpatient Hospital Stay: Admit: 2014-02-04 | Payer: PRIVATE HEALTH INSURANCE

## 2014-02-03 DIAGNOSIS — M4310 Spondylolisthesis, site unspecified: Secondary | ICD-10-CM

## 2014-02-03 LAB — HEMOGLOBIN AND HEMATOCRIT, BLOOD
Hematocrit: 36 % — ABNORMAL LOW (ref 38.5–50.0)
Hemoglobin: 12.3 g/dL — ABNORMAL LOW (ref 13.2–17.1)
MCH: 29.6 pg (ref 27.0–33.0)
MCHC: 34.2 g/dL (ref 32.0–36.0)
MCV: 86.4 fL (ref 80.0–100.0)
RDW: 17 % — ABNORMAL HIGH (ref 11.0–15.0)

## 2014-02-03 LAB — SED RATE: Sed Rate: 16 mm/hr (ref 0–20)

## 2014-02-03 LAB — PROTIME-INR
INR: 0.9 (ref 0.9–1.1)
Protime: 11.7 seconds (ref 11.6–14.4)

## 2014-02-03 LAB — ABO/RH: Rh Type: POSITIVE

## 2014-02-03 LAB — ANTIBODY SCREEN: Antibody Screen: NEGATIVE

## 2014-02-03 MED ORDER — promethazine (PHENERGAN) tablet 25 mg
25 | Freq: Four times a day (QID) | ORAL | Status: AC | PRN
Start: 2014-02-03 — End: 2014-02-04

## 2014-02-03 MED ORDER — midazolam (PF) (VERSED) 1 mg/mL injection
1 | INTRAMUSCULAR | Status: AC
Start: 2014-02-03 — End: ?

## 2014-02-03 MED ORDER — nalOXone (NARCAN) injection 0.04 mg
0.4 | INTRAMUSCULAR | Status: AC | PRN
Start: 2014-02-03 — End: 2014-02-03

## 2014-02-03 MED ORDER — lactated ringers infusion
INTRAVENOUS | Status: AC
Start: 2014-02-03 — End: 2014-02-03
  Administered 2014-02-03: 14:00:00 50 mL/h via INTRAVENOUS

## 2014-02-03 MED ORDER — floseal hemostatic sealant
Status: AC | PRN
Start: 2014-02-03 — End: 2014-02-03
  Administered 2014-02-03 (×2): 10 via TOPICAL

## 2014-02-03 MED ORDER — sodium chloride 0.9% 0.9 %
INTRAMUSCULAR | Status: AC
Start: 2014-02-03 — End: ?

## 2014-02-03 MED ORDER — midazolam (PF) (VERSED) injection
1 | INTRAMUSCULAR | Status: AC | PRN
Start: 2014-02-03 — End: 2014-02-03
  Administered 2014-02-03: 16:00:00 2 via INTRAVENOUS

## 2014-02-03 MED ORDER — sodium chloride 0.9 % infusion
INTRAVENOUS | Status: AC
Start: 2014-02-03 — End: 2014-02-04

## 2014-02-03 MED ORDER — HYDROmorphone (DILAUDID) injection Syrg
2 | INTRAMUSCULAR | Status: AC | PRN
Start: 2014-02-03 — End: 2014-02-03
  Administered 2014-02-03 (×4): .5 via INTRAVENOUS
  Administered 2014-02-03: 16:00:00 1 via INTRAVENOUS

## 2014-02-03 MED ORDER — diphenhydrAMINE (BENADRYL) injection 12.5 mg
50 | Freq: Four times a day (QID) | INTRAMUSCULAR | Status: AC | PRN
Start: 2014-02-03 — End: 2014-02-04

## 2014-02-03 MED ORDER — metoprolol tartrate (LOPRESSOR) injection
5 | INTRAVENOUS | Status: AC | PRN
Start: 2014-02-03 — End: 2014-02-03
  Administered 2014-02-03: 20:00:00 2 via INTRAVENOUS

## 2014-02-03 MED ORDER — ondansetron (ZOFRAN) tablet 8 mg
8 | Freq: Four times a day (QID) | ORAL | Status: AC | PRN
Start: 2014-02-03 — End: 2014-02-05

## 2014-02-03 MED ORDER — bacitracin 50,000 Units in sodium chloride, irrigation 0.9 % 1,000 mL IRRIGATION
0.9 | Status: AC | PRN
Start: 2014-02-03 — End: 2014-02-03
  Administered 2014-02-03: 17:00:00 50000 [IU]

## 2014-02-03 MED ORDER — lactated ringers infusion
INTRAVENOUS | Status: AC
Start: 2014-02-03 — End: ?

## 2014-02-03 MED ORDER — oxyCODONE-acetaminophen (PERCOCET) 5-325 mg per tablet 1 tablet
5-325 | ORAL | Status: AC | PRN
Start: 2014-02-03 — End: 2014-02-05

## 2014-02-03 MED ORDER — HYDROmorphone (DILAUDID) injection Syrg 0.6 mg
1 | INTRAMUSCULAR | Status: AC | PRN
Start: 2014-02-03 — End: 2014-02-03
  Administered 2014-02-03: 21:00:00 0.6 mg via INTRAVENOUS

## 2014-02-03 MED ORDER — acetaminophen (OFIRMEV) 1,000 mg/100 mL (10 mg/mL) Soln
1000 | INTRAVENOUS | Status: AC
Start: 2014-02-03 — End: ?

## 2014-02-03 MED ORDER — gabapentin (NEURONTIN) capsule 800 mg
400 | Freq: Three times a day (TID) | ORAL | Status: AC
Start: 2014-02-03 — End: 2014-02-05
  Administered 2014-02-04 – 2014-02-05 (×5): 800 mg via ORAL

## 2014-02-03 MED ORDER — methocarbamol (ROBAXIN) 100 mg/mL injection
100 | INTRAMUSCULAR | Status: AC
Start: 2014-02-03 — End: ?

## 2014-02-03 MED ORDER — multivitamin (THERAGRAN) tablet 1 tablet
Freq: Every day | ORAL | Status: AC
Start: 2014-02-03 — End: 2014-02-05
  Administered 2014-02-04 – 2014-02-05 (×2): 1 via ORAL

## 2014-02-03 MED ORDER — fentaNYL (SUBLIMAZE) injection
50 | INTRAMUSCULAR | Status: AC | PRN
Start: 2014-02-03 — End: 2014-02-03

## 2014-02-03 MED ORDER — pantoprazole (PROTONIX) EC tablet 40 mg
40 | Freq: Every morning | ORAL | Status: AC
Start: 2014-02-03 — End: 2014-02-05
  Administered 2014-02-04 – 2014-02-05 (×2): 40 mg via ORAL

## 2014-02-03 MED ORDER — bacitracin 50,000 Units in sodium chloride, irrigation 0.9 % 3,000 mL IRRIGATION
0.9 | Status: AC | PRN
Start: 2014-02-03 — End: 2014-02-03
  Administered 2014-02-03: 17:00:00 50000 [IU]

## 2014-02-03 MED ORDER — neostigmine (PROSTIGMINE) 1 mg/mL injection
1 | INTRAMUSCULAR | Status: AC
Start: 2014-02-03 — End: ?

## 2014-02-03 MED ORDER — metoprolol tartrate (LOPRESSOR) 5 mg/5 mL injection
5 | INTRAVENOUS | Status: AC
Start: 2014-02-03 — End: ?

## 2014-02-03 MED ORDER — nalOXone (NARCAN) injection 0.4 mg
0.4 | INTRAMUSCULAR | Status: AC | PRN
Start: 2014-02-03 — End: 2014-02-04

## 2014-02-03 MED ORDER — diphenhydrAMINE (BENADRYL) capsule 25 mg
25 | Freq: Four times a day (QID) | ORAL | Status: AC | PRN
Start: 2014-02-03 — End: 2014-02-04

## 2014-02-03 MED ORDER — neostigmine (PROSTIGMINE) injection
1 | INTRAMUSCULAR | Status: AC | PRN
Start: 2014-02-03 — End: 2014-02-03
  Administered 2014-02-03: 20:00:00 3 via INTRAVENOUS

## 2014-02-03 MED ORDER — ondansetron (ZOFRAN) 4 mg/2 mL injection 8 mg
4 | Freq: Four times a day (QID) | INTRAMUSCULAR | Status: AC | PRN
Start: 2014-02-03 — End: 2014-02-05

## 2014-02-03 MED ORDER — nalOXone (NARCAN) injection 0.1 mg
0.4 | INTRAMUSCULAR | Status: AC | PRN
Start: 2014-02-03 — End: 2014-02-04

## 2014-02-03 MED ORDER — rocuronium (ZEMURON) 10 mg/mL injection
10 | INTRAVENOUS | Status: AC
Start: 2014-02-03 — End: ?

## 2014-02-03 MED ORDER — promethazine (PHENERGAN) injection 6.25 mg
25 | Freq: Four times a day (QID) | INTRAMUSCULAR | Status: AC | PRN
Start: 2014-02-03 — End: 2014-02-03

## 2014-02-03 MED ORDER — propofol 10 mg/ml (DIPRIVAN) 10 mg/mL injection
10 | INTRAVENOUS | Status: AC
Start: 2014-02-03 — End: ?

## 2014-02-03 MED ORDER — ondansetron (ZOFRAN) 4 mg/2 mL injection
4 | INTRAMUSCULAR | Status: AC
Start: 2014-02-03 — End: ?

## 2014-02-03 MED ORDER — dextrose 5 % and 0.45 % NaCl infusion 1000 mL
INTRAVENOUS | Status: AC
Start: 2014-02-03 — End: 2014-02-04
  Administered 2014-02-04: 02:00:00 100 mL/h via INTRAVENOUS

## 2014-02-03 MED ORDER — fentaNYL (SUBLIMAZE) injection 12.5 mcg
50 | INTRAMUSCULAR | Status: AC | PRN
Start: 2014-02-03 — End: 2014-02-03

## 2014-02-03 MED ORDER — vancomycin (VANCOCIN) injection
1000 | INTRAVENOUS | Status: AC | PRN
Start: 2014-02-03 — End: 2014-02-03
  Administered 2014-02-03: 19:00:00 1 via TOPICAL

## 2014-02-03 MED ORDER — ondansetronZOFRAN4mg2mLinjection4mg
4 | Freq: Four times a day (QID) | INTRAMUSCULAR | Status: AC | PRN
Start: 2014-02-03 — End: 2014-02-05

## 2014-02-03 MED ORDER — glycopyrrolate (ROBINUL) 0.2 mg/mL injection
0.2 | INTRAMUSCULAR | Status: AC
Start: 2014-02-03 — End: ?

## 2014-02-03 MED ORDER — lidocaine (PF) 20 mg/mL (2 %) Soln
20 | INTRAVENOUS | Status: AC | PRN
Start: 2014-02-03 — End: 2014-02-03
  Administered 2014-02-03: 16:00:00 60 via INTRAVENOUS

## 2014-02-03 MED ORDER — carboxymethylcellulose (REFRESH PLUS) 0.5 % ophthalmic solution
0.5 | OPHTHALMIC | Status: AC
Start: 2014-02-03 — End: ?

## 2014-02-03 MED ORDER — succinylcholine (QUELICIN) injection
20 | INTRAMUSCULAR | Status: AC | PRN
Start: 2014-02-03 — End: 2014-02-03
  Administered 2014-02-03: 16:00:00 100 via INTRAVENOUS

## 2014-02-03 MED ORDER — dexamethasone (DECADRON) injection
4 | INTRAMUSCULAR | Status: AC | PRN
Start: 2014-02-03 — End: 2014-02-03
  Administered 2014-02-03: 16:00:00 6 via INTRAVENOUS

## 2014-02-03 MED ORDER — phenylephrine (NEO-SYNEPHRINE) injection
10 | INTRAMUSCULAR | Status: AC | PRN
Start: 2014-02-03 — End: 2014-02-03
  Administered 2014-02-03 (×6): 100 via INTRAVENOUS

## 2014-02-03 MED ORDER — acetaminophen (TYLENOL) suppository 650 mg
650 | RECTAL | Status: AC | PRN
Start: 2014-02-03 — End: 2014-02-05

## 2014-02-03 MED ORDER — clindamycin (CLEOCIN) IVPB 600 mg
600 | Freq: Three times a day (TID) | INTRAVENOUS | Status: AC
Start: 2014-02-03 — End: 2014-02-04
  Administered 2014-02-04 (×2): 600 mg via INTRAVENOUS

## 2014-02-03 MED ORDER — senna-docusate (SENNA-S) 8.6-50 mg per tablet 1 tablet
8.6-50 | Freq: Two times a day (BID) | ORAL | Status: AC
Start: 2014-02-03 — End: 2014-02-05
  Administered 2014-02-04 – 2014-02-05 (×4): 1 via ORAL

## 2014-02-03 MED ORDER — diazepam (VALIUM) tablet 5 mg
5 | Freq: Three times a day (TID) | ORAL | Status: AC | PRN
Start: 2014-02-03 — End: 2014-02-05

## 2014-02-03 MED ORDER — clindamycin (CLEOCIN) IVPB 600 mg
600 | INTRAVENOUS | Status: AC | PRN
Start: 2014-02-03 — End: 2014-02-03
  Administered 2014-02-03: 16:00:00 600 mg via INTRAVENOUS

## 2014-02-03 MED ORDER — fentaNYL (SUBLIMAZE) injection 50 mcg
50 | INTRAMUSCULAR | Status: AC | PRN
Start: 2014-02-03 — End: 2014-02-03
  Administered 2014-02-03: 21:00:00 50 ug via INTRAVENOUS

## 2014-02-03 MED ORDER — HYDROmorphone (DILAUDID) 2 mg/mL injection Syrg
2 | INTRAMUSCULAR | Status: AC
Start: 2014-02-03 — End: ?

## 2014-02-03 MED ORDER — ondansetron (ZOFRAN) tablet 4 mg
4 | Freq: Four times a day (QID) | ORAL | Status: AC | PRN
Start: 2014-02-03 — End: 2014-02-05

## 2014-02-03 MED ORDER — HYDROmorphone (DILAUDID) injection Syrg 0.4 mg
0.5 | INTRAMUSCULAR | Status: AC | PRN
Start: 2014-02-03 — End: 2014-02-03

## 2014-02-03 MED ORDER — morphine (PF) 30 mg/30 mL syringe PCAS 30 mg
30 | INTRAVENOUS | Status: AC
Start: 2014-02-03 — End: 2014-02-04
  Administered 2014-02-03: 21:00:00 30 mg via INTRAVENOUS

## 2014-02-03 MED ORDER — bisacodyl (DULCOLAX) suppository 10 mg
10 | Freq: Every day | RECTAL | Status: AC | PRN
Start: 2014-02-03 — End: 2014-02-05

## 2014-02-03 MED ORDER — bacitracin50000unitinjection
50000 | INTRAMUSCULAR | Status: AC
Start: 2014-02-03 — End: 2014-02-03

## 2014-02-03 MED ORDER — fentaNYL (SUBLIMAZE) injection
50 | INTRAMUSCULAR | Status: AC | PRN
Start: 2014-02-03 — End: 2014-02-03
  Administered 2014-02-03: 16:00:00 100 via INTRAVENOUS

## 2014-02-03 MED ORDER — lactated ringers infusion
INTRAVENOUS | Status: AC | PRN
Start: 2014-02-03 — End: 2014-02-03
  Administered 2014-02-03 (×3): via INTRAVENOUS

## 2014-02-03 MED ORDER — lipase-protease-amylase (ZENPEP DR (20,000 units of lipase)) capsule 1 capsule
20000-68000 | Freq: Three times a day (TID) | ORAL | Status: AC
Start: 2014-02-03 — End: 2014-02-05
  Administered 2014-02-04 – 2014-02-05 (×4): 1 via ORAL

## 2014-02-03 MED ORDER — fentaNYL (SUBLIMAZE) injection 25 mcg
50 | INTRAMUSCULAR | Status: AC | PRN
Start: 2014-02-03 — End: 2014-02-03
  Administered 2014-02-03: 21:00:00 25 ug via INTRAVENOUS

## 2014-02-03 MED ORDER — acetaminophenTYLENOLtablet650mg
325 | ORAL | Status: AC | PRN
Start: 2014-02-03 — End: 2014-02-05
  Administered 2014-02-05: 650 mg via ORAL

## 2014-02-03 MED ORDER — rocuronium (ZEMURON) injection
10 | INTRAVENOUS | Status: AC | PRN
Start: 2014-02-03 — End: 2014-02-03
  Administered 2014-02-03: 16:00:00 40 via INTRAVENOUS
  Administered 2014-02-03: 16:00:00 10 via INTRAVENOUS

## 2014-02-03 MED ORDER — ondansetron (ZOFRAN) 4 mg/2 mL injection 4 mg
4 | Freq: Three times a day (TID) | INTRAMUSCULAR | Status: AC | PRN
Start: 2014-02-03 — End: 2014-02-03

## 2014-02-03 MED ORDER — acetaminophen (OFIRMEV) Soln
1000 | INTRAVENOUS | Status: AC | PRN
Start: 2014-02-03 — End: 2014-02-03
  Administered 2014-02-03: 18:00:00 1000 via INTRAVENOUS

## 2014-02-03 MED ORDER — ondansetron (ZOFRAN) 4 mg/2 mL injection
4 | INTRAMUSCULAR | Status: AC | PRN
Start: 2014-02-03 — End: 2014-02-03
  Administered 2014-02-03: 20:00:00 4 via INTRAVENOUS

## 2014-02-03 MED ORDER — promethazine (PHENERGAN) suppository 25 mg
25 | Freq: Four times a day (QID) | RECTAL | Status: AC | PRN
Start: 2014-02-03 — End: 2014-02-04

## 2014-02-03 MED ORDER — nalOXone (NARCAN) injection 0.2 mg
0.4 | INTRAMUSCULAR | Status: AC | PRN
Start: 2014-02-03 — End: 2014-02-04

## 2014-02-03 MED ORDER — oxyCODONE-acetaminophen (PERCOCET) 5-325 mg per tablet 2 tablet
5-325 | ORAL | Status: AC | PRN
Start: 2014-02-03 — End: 2014-02-05

## 2014-02-03 MED ORDER — lidocaine (PF) 20 mg/mL (2 %) Soln 20 mg
20 | Freq: Once | INTRAVENOUS | Status: AC | PRN
Start: 2014-02-03 — End: 2014-02-03

## 2014-02-03 MED ORDER — aluminum & magnesium hydroxide-simethicone (MYLANTA, MAALOX) suspension 15 mL
400-400-40 | ORAL | Status: AC | PRN
Start: 2014-02-03 — End: 2014-02-05

## 2014-02-03 MED ORDER — promethazine (PHENERGAN) injection 12.5 mg
25 | Freq: Four times a day (QID) | INTRAMUSCULAR | Status: AC | PRN
Start: 2014-02-03 — End: 2014-02-04

## 2014-02-03 MED ORDER — fentaNYL (SUBLIMAZE) 50 mcg/mL injection
50 | INTRAMUSCULAR | Status: AC
Start: 2014-02-03 — End: ?

## 2014-02-03 MED ORDER — methocarbamol (ROBAXIN) injection
100 | INTRAMUSCULAR | Status: AC | PRN
Start: 2014-02-03 — End: 2014-02-03
  Administered 2014-02-03: 20:00:00 1000 via INTRAVENOUS

## 2014-02-03 MED ORDER — HYDROmorphone (DILAUDID) injection Syrg 0.2 mg
0.5 | INTRAMUSCULAR | Status: AC | PRN
Start: 2014-02-03 — End: 2014-02-03

## 2014-02-03 MED ORDER — propofol 10 mg/ml (DIPRIVAN) injection
10 | INTRAVENOUS | Status: AC | PRN
Start: 2014-02-03 — End: 2014-02-03
  Administered 2014-02-03: 16:00:00 180 via INTRAVENOUS

## 2014-02-03 MED ORDER — glycopyrrolate (ROBINUL) injection
0.2 | INTRAMUSCULAR | Status: AC | PRN
Start: 2014-02-03 — End: 2014-02-03
  Administered 2014-02-03: 20:00:00 .4 via INTRAVENOUS

## 2014-02-03 MED FILL — ZENPEP 20,000 UNIT-68,000 UNIT-109,000 UNIT CAPSULE,DELAYED RELEASE: 20000-68000 20,000-68,000 -109,000 unit | ORAL | Qty: 1

## 2014-02-03 MED FILL — LACTATED RINGERS INTRAVENOUS SOLUTION: INTRAVENOUS | Qty: 1000

## 2014-02-03 MED FILL — LACTATED RINGERS INTRAVENOUS SOLUTION: 50.00 50.00 mL/hr | INTRAVENOUS | Qty: 1000

## 2014-02-03 MED FILL — HYDROMORPHONE 2 MG/ML INJECTION SYRINGE: 2 2 mg/mL | INTRAMUSCULAR | Qty: 1

## 2014-02-03 MED FILL — FENTANYL (PF) 50 MCG/ML INJECTION SOLUTION: 50 50 mcg/mL | INTRAMUSCULAR | Qty: 2

## 2014-02-03 MED FILL — DEXTROSE 5 % AND 0.45 % SODIUM CHLORIDE INTRAVENOUS SOLUTION: 100.00 100.00 mL/hr | INTRAVENOUS | Qty: 1000

## 2014-02-03 MED FILL — SENNA-S 8.6 MG-50 MG TABLET: 8.6-50 8.6-50 mg | ORAL | Qty: 1

## 2014-02-03 MED FILL — GLYCOPYRROLATE 0.2 MG/ML INJECTION SOLUTION: 0.2 0.2 mg/mL | INTRAMUSCULAR | Qty: 2

## 2014-02-03 MED FILL — GABAPENTIN 400 MG CAPSULE: 400 400 MG | ORAL | Qty: 2

## 2014-02-03 MED FILL — ONDANSETRON HCL (PF) 4 MG/2 ML INJECTION SOLUTION: 4 4 mg/2 mL | INTRAMUSCULAR | Qty: 2

## 2014-02-03 MED FILL — OFIRMEV 1,000 MG/100 ML (10 MG/ML) INTRAVENOUS SOLUTION: 1000 1,000 mg/100 mL (10 mg/mL) | INTRAVENOUS | Qty: 100

## 2014-02-03 MED FILL — MIDAZOLAM (PF) 1 MG/ML INJECTION SOLUTION: 1 1 mg/mL | INTRAMUSCULAR | Qty: 2

## 2014-02-03 MED FILL — REFRESH PLUS 0.5 % EYE DROPS IN A DROPPERETTE: 0.5 0.5 % | OPHTHALMIC | Qty: 1

## 2014-02-03 MED FILL — ROBAXIN 100 MG/ML INJECTION SOLUTION: 100 100 mg/mL | INTRAMUSCULAR | Qty: 10

## 2014-02-03 MED FILL — METOPROLOL TARTRATE 5 MG/5 ML INTRAVENOUS SOLUTION: 5 5 mg/5 mL | INTRAVENOUS | Qty: 5

## 2014-02-03 MED FILL — CLEOCIN 600 MG/50 ML IN 5 % DEXTROSE INTRAVENOUS PIGGYBACK: 600 600 mg/50 mL | INTRAVENOUS | Qty: 50

## 2014-02-03 MED FILL — NEOSTIGMINE METHYLSULFATE 1 MG/ML INJECTION SOLUTION: 1 1 mg/mL | INTRAMUSCULAR | Qty: 10

## 2014-02-03 MED FILL — CLINDAMYCIN 600 MG/50 ML IN 5 % DEXTROSE INTRAVENOUS PIGGYBACK: 600 600 mg/50 mL | INTRAVENOUS | Qty: 50

## 2014-02-03 MED FILL — PROPOFOL 10 MG/ML INTRAVENOUS EMULSION: 10 10 mg/mL | INTRAVENOUS | Qty: 20

## 2014-02-03 MED FILL — MORPHINE (PF) 30 MG/30 ML (1 MG/ML) PCA INTRAVENOUS SOLUTION: 30 30 mg/30 mL (1 mg/mL) | INTRAVENOUS | Qty: 30

## 2014-02-03 MED FILL — BACITRACIN 50,000 UNIT INTRAMUSCULAR SOLUTION: 50000 50,000 unit | INTRAMUSCULAR | Qty: 2

## 2014-02-03 MED FILL — HYDROMORPHONE 1 MG/ML INJECTION SYRINGE: 1 1 mg/mL | INTRAMUSCULAR | Qty: 1

## 2014-02-03 MED FILL — SODIUM CHLORIDE 0.9 % INJECTION SOLUTION: INTRAMUSCULAR | Qty: 10

## 2014-02-03 MED FILL — ROCURONIUM 10 MG/ML INTRAVENOUS SOLUTION: 10 10 mg/mL | INTRAVENOUS | Qty: 5

## 2014-02-03 NOTE — Unmapped (Signed)
Anesthesia Post Note    Patient: Arthur Neal    Procedure(s) Performed: Procedure(s):  LUMBAR 4-5 TRANSFORAMINAL LUMBAR INTERBODY FUSION, REMOVAL INSTRUMENTATION, REMOVAL SPINAL CORD STIMULATOR    Anesthesia type: general endotracheal    Patient location: PACU    Post pain: Adequate analgesia    Post assessment: no apparent anesthetic complications    Last Vitals:   Filed Vitals:    02/03/14 1800   BP: 117/73   Pulse: 98   Temp:    Resp: 17   SpO2: 99%       Post vital signs: stable    Level of consciousness: awake and alert     Complications: None

## 2014-02-03 NOTE — Unmapped (Signed)
[facility]    Hospitalist Orthopedic Consultation Note    Patient's PCP: Non Staff Referal Provider Gen     Requesting Physician: Cephus Shelling, MD  Name: Arthur Neal DOB: 1950/10/31  MRN: [id number]  02/03/2014       Reason for Consultation:    Management of  HTN GER  HX PANCREATITIS      History of Present Illness:    Arthur Neal is a very pleasant 63 y.o. male with a history of multiple medical problems presenting for elective L4-5 DECOMPRESSION AND FUSION consulted for above, he was otherwise in there usual state of health prior to this admission.   Seymour Bars reports SOME POST OP DISCOMFORT  NO CP SOB N/V      Past Medical / Surgical History:      Past Medical History   Diagnosis Date   ??? Hypertension    ??? GERD (gastroesophageal reflux disease)    ??? Benign prostatic hyperplasia    ??? Pancreatitis    ??? Arthritis    ??? Back pain    ??? Wears glasses      Past Surgical History   Procedure Laterality Date   ??? Cervical laminectomy       C5-S1 diskectomy   ??? Amputation       Traumatic   ??? Incision and drainage  02/2008     s/p I&D, and revision and grafting   ??? Appendectomy     ??? Ct upper extremity left w and wo contrast  2004     2014   ??? Hernia repair     ??? Skin graft  2014     2009   ??? Tendon repair Left      tendon & nerve repair left hand        Medications:      No current facility-administered medications on file prior to encounter.     Current Outpatient Prescriptions on File Prior to Encounter   Medication Sig Dispense Refill   ??? gabapentin (NEURONTIN) 800 MG tablet Take 800 mg by mouth 3 (three) times daily.         ??? lipase-protease-amylase (ZENPEP DR, 20,000 UNITS OF LIPASE,) 20,000-68,000 -109,000 unit CpDR Take 1 capsule by mouth 3 times a day with meals.       ??? LUTEIN ORAL Take by mouth.       ??? multivitamin capsule Take 1 capsule by mouth daily.       ??? NAPROXEN SODIUM (ALEVE ORAL) Take by mouth.       ??? pantoprazole (PROTONIX) 40 MG tablet Take 40 mg by mouth every morning before breakfast.             Allergies:    Ace inhibitors; Ambien; Amitriptyline hcl; Cephalosporins; Fish containing products; Iodine and iodide containing products; Zolpidem tartrate; and Keflex     Social History:    TOBACCO:   reports that he has never smoked. He has never used smokeless tobacco.     ETOH:   reports that he drinks alcohol.     Family History:      Family History   Problem Relation Age of Onset   ??? Heart attack Mother 10   ??? Heart attack Father 97   ??? Diabetes Brother         Review of Systems:    Pertinent Review of Systems - History obtained from chart review and the patient  General ROS: all negative  Psychological ROS:  all negative  Ophthalmic ROS: negative  ENT ROS: all negative  Allergy and Immunology ROS: negative  Hematological and Lymphatic ROS: all negative  Endocrine ROS: all  negative  Respiratory ROS: all negative  Cardiovascular ROS: all negative  Gastrointestinal ROS: all negative  Genito-Urinary ROS: no dysuria, trouble voiding, or hematuria  Musculoskeletal ROS: positive for - back pain post op  Neurological ROS: all negative  Dermatological ROS: all negative.   Otherwise a 10-organ ROS was obtained and otherwise unremarkable.     Physical Exam:    Temp:  [97.8 ??F (36.6 ??C)-99 ??F (37.2 ??C)] 97.8 ??F (36.6 ??C)  Heart Rate:  [83-103] 92  Resp:  [8-30] 16  BP: (99-138)/(64-91) 126/91 mmHg  FiO2:  [58 %-100 %] 100 %    Intake/Output Summary (Last 24 hours) at 02/03/14 2203  Last data filed at 02/03/14 1745   Gross per 24 hour   Intake   2600 ml   Output    965 ml   Net   1635 ml          BP 126/91   Pulse 92   Temp(Src) 97.8 ??F (36.6 ??C) (Oral)   Resp 16   Ht 5' 8 (1.727 m)   Wt 164 lb 11 oz (74.702 kg)   BMI 25.05 kg/m2   SpO2 95%    General appearance: alert, appears stated age, cooperative and no distress  Head: Normocephalic, without obvious abnormality, atraumatic  Eyes: conjunctivae/corneas clear. PERRL, EOM's intact. Fundi benign.  Ears: normal TM's and external ear canals both ears  Nose:  Nares normal. Septum midline. Mucosa normal. No drainage or sinus tenderness.  Throat: lips, mucosa, and tongue normal; teeth and gums normal  Neck: no adenopathy, no carotid bruit, no JVD, supple, symmetrical, trachea midline and thyroid not enlarged, symmetric, no tenderness/mass/nodules  Lungs: clear to auscultation bilaterally and no respiratory distress  Heart: regular rate and rhythm, S1, S2 normal, no murmur, click, rub or gallop  Abdomen: soft, non-tender; bowel sounds normal; no masses,  no organomegaly  Extremities: extremities normal, atraumatic, no cyanosis or edema and right  bka    Pulses: 2+ and symmetric  Skin: Skin color, texture, turgor normal. No rashes or lesions  Lymph nodes: Cervical, supraclavicular, and axillary nodes normal.  Neurologic: Grossly normal     Diagnostic Studies:      Results for orders placed during the hospital encounter of 02/03/14   PROTIME-INR       Result Value Ref Range    Protime 11.7  11.6 - 14.4 seconds    INR 0.9  0.9 - 1.1   SED RATE       Result Value Ref Range    Sed Rate 16  0 - 20 mm/hr   HEMOGLOBIN AND HEMATOCRIT, BLOOD       Result Value Ref Range    Hemoglobin 12.3 (*) 13.2 - 17.1 g/dL    Hematocrit 16.1 (*) 38.5 - 50.0 %    MCV 86.4  80.0 - 100.0 fL    MCH 29.6  27.0 - 33.0 pg    MCHC 34.2  32.0 - 36.0 g/dL    RDW 09.6 (*) 04.5 - 15.0 %   ABO/RH       Result Value Ref Range    ABO Grouping O      Rh Type Positive     ANTIBODY SCREEN       Result Value Ref Range    Antibody Screen Negative  Assessment & Plan:   POST OP L4-5 DECOMPRESSION AND FUSION  recommend continuing with current medical management as per ortho.  HTN  Hold meds for now and monitor    GERD ppi  Thank you Cephus Shelling, MD for the opportunity to be involved in your patients care. If you have any questions or concerns regarding his hospitalization, please feel free to give [place] a call at [phone].  Hanif Radin A. Lysa Livengood D.O.

## 2014-02-03 NOTE — Unmapped (Addendum)
ANESTHESIOLOGY PRE-PROCEDURAL EVALUATION    Arthur Neal is a 63 y.o. year old male presenting for:    Procedure(s):  LUMBAR 4-5 TRANSFORAMINAL LUMBAR INTERBODY FUSION, REMOVAL INSTRUMENTATION, REMOVAL SPINAL CORD STIMULATOR    Surgeon:   Cephus Shelling, MD    Anesthesia Evaluation    Patient summary reviewed and nursing notes reviewed.       No history of anesthetic complications   I have reviewed the History and Physical Exam, any relevant changes are noted in the anesthesia pre-operative evaluation.    Medical History / Review of Systems:    Cardiovascular:    Exercise tolerance: good  Hypertension is well controlled.    (-) pacemaker, pulmonary hypertension, valvular problems/murmurs, past MI, CAD, cardiomyopathy, CABG/stent, dysrhythmias, angina.    Neuro/Muscoloskeletal/Psych:      (-) seizures, TIA, CVA.     Pulmonary:      (-) no pneumonia, COPD, asthma, shortness of breath, recent URI, sleep apnea.       GI/Hepatic/Renal:    GERD is well controlled.    No bowel prep.  (-) PUD, hepatitis, liver disease, renal disease, no difficulty swallowing, no end stage liver disease.    Endo/Other:        (-) diabetes mellitus, hypothyroidism, hyperthyroidism, no anemia, no thrombocytopenia, no bleeding disorder.       PAST MEDICAL HISTORY:  Past Medical History   Diagnosis Date   ??? Hypertension    ??? GERD (gastroesophageal reflux disease)    ??? Benign prostatic hyperplasia    ??? Pancreatitis    ??? Arthritis    ??? Back pain    ??? Wears glasses        PAST SURGICAL HISTORY:  Past Surgical History   Procedure Laterality Date   ??? Cervical laminectomy       C5-S1 diskectomy   ??? Amputation       Traumatic   ??? Incision and drainage  02/2008     s/p I&D, and revision and grafting   ??? Appendectomy     ??? Ct upper extremity left w and wo contrast  2004     2014   ??? Hernia repair     ??? Skin graft  2014     2009   ??? Tendon repair Left      tendon & nerve repair left hand       FAMILY HISTORY:  Family History   Problem Relation Age of Onset    ??? Heart attack Mother 6   ??? Heart attack Father 73   ??? Diabetes Brother        SOCIAL HISTORY:  History     Social History   ??? Marital Status: Married     Spouse Name: N/A     Number of Children: N/A   ??? Years of Education: N/A     Occupational History   ??? Not on file.     Social History Main Topics   ??? Smoking status: Never Smoker    ??? Smokeless tobacco: Not on file      Comment: 04/23/2008   ??? Alcohol Use: Yes      Comment: 04/23/2008, occass   ??? Drug Use: No      Comment: 04/23/2008   ??? Sexual Activity: Yes     Partners: Female     Pharmacist, hospital Protection: None     Other Topics Concern   ??? Not on file     Social History Narrative   ??? No  narrative on file       MEDICATIONS:  No current facility-administered medications on file prior to encounter.     Current Outpatient Prescriptions on File Prior to Encounter   Medication Sig Dispense Refill   ??? gabapentin (NEURONTIN) 800 MG tablet Take 800 mg by mouth 3 (three) times daily.         ??? lipase-protease-amylase (ZENPEP DR, 20,000 UNITS OF LIPASE,) 20,000-68,000 -109,000 unit CpDR Take 1 capsule by mouth 3 times a day with meals.       ??? LUTEIN ORAL Take by mouth.       ??? multivitamin capsule Take 1 capsule by mouth daily.       ??? NAPROXEN SODIUM (ALEVE ORAL) Take by mouth.       ??? pantoprazole (PROTONIX) 40 MG tablet Take 40 mg by mouth every morning before breakfast.           ALLERGIES:  Allergies   Allergen Reactions   ??? Ace Inhibitors    ??? Ambien [Zolpidem]    ??? Amitriptyline Hcl    ??? Cephalosporins Other (See Comments)     Generalize rash/confusion   ??? Fish Containing Products    ??? Iodine And Iodide Containing Products    ??? Zolpidem Tartrate    ??? Keflex [Cephalexin] Rash       VITAL SIGNS:  Wt Readings from Last 3 Encounters:   01/01/14 165 lb (74.844 kg)   06/20/13 165 lb (74.844 kg)   06/18/13 165 lb (74.844 kg)     Ht Readings from Last 3 Encounters:   01/01/14 5' 8 (1.727 m)   06/20/13 5' 8 (1.727 m)   06/18/13 5' 8 (1.727 m)     Temp Readings from Last  3 Encounters:   06/20/13 98.3 ??F (36.8 ??C) Oral   02/28/13 97.9 ??F (36.6 ??C) Oral   02/28/13 97.9 ??F (36.6 ??C) Oral     BP Readings from Last 3 Encounters:   08/15/13 110/64   08/15/13 110/64   08/15/13 110/64     Pulse Readings from Last 3 Encounters:   06/20/13 67   06/18/13 94   02/28/13 87     SpO2 Readings from Last 3 Encounters:   08/15/13 99%   08/15/13 99%   08/15/13 99%       Physical Exam:    Airway:     Mallampati: I  Mouth Opening: >2 FB  TM distance: > = 3 FB  (-) no facial hair, neck not short      Dental:   - normal exam         Pulmonary:   - normal exam     Cardiovascular:  - normal exam     Neuro/Musculoskeletal/Psych:  - normal neurological exam.         Abdominal:    - normal exam    Current OB Status:       Other Findings:        LAB RESULTS:  Lab Results   Component Value Date    WBC 7.6 05/12/2008    HGB 9.5 L 05/12/2008    HCT 29.7 L 05/12/2008    MCV 83 05/12/2008    PLT 504 H 05/12/2008               Lab Results   Component Value Date    INR 0.9 02/03/2014       Lab Results   Component Value Date    ABOGROUP O 02/03/2014  Anesthesia Plan:    ASA 2     Anesthesia Type:  general endotracheal.   (PIVx2, PACU post op, opioid analgesia. )    Anesthetic plan and risks discussed with patient.    Plan, alternatives, and risks of anesthesia, including death, have been explained to and discussed with the patient/legal guardian.  By my assessment, the patient/legal guardian understands and agrees.  Scenario presented in detail.  Questions answered.    Use of blood products discussed with patient whom consented to blood products.   Plan discussed with resident, attending and CRNA.

## 2014-02-03 NOTE — Unmapped (Signed)
Problem: Knowledge Deficit  Goal: Patient/family/caregiver demonstrates understanding of disease process, treatment plan, medications, and discharge instructions  Complete learning assessment and assess knowledge base.   Outcome: Progressing  .

## 2014-02-03 NOTE — Unmapped (Signed)
Patient up to floor @ 1830. Alert and oriented x 4. Family at bedside. No handoff with PACU, RN,  Octaviano Batty, d/t patient not in system at time of arrival to floor.

## 2014-02-03 NOTE — Unmapped (Signed)
Recovery care complete. No distress. VSS, rates pain 5-6/10 on scale, tolerable. Tolerating ice chips. Sign out by anesthesia. Report called to [place], RN

## 2014-02-03 NOTE — Unmapped (Signed)
Problem: Inadequate Airway Clearance  Goal: Patient will maintain patent airway  Assess and monitor breath sounds, cough and sputum (if present), and intake/output. Collaborate with respiratory therapy to administer medications and treatments.   Outcome: Progressing  Incentive Spirometry ordered to assist in lung expansion and improve oxygenation.

## 2014-02-03 NOTE — Unmapped (Signed)
Anesthesia Transfer of Care Note    Patient: Arthur Neal  Procedure(s) Performed: Procedure(s):  LUMBAR 4-5 TRANSFORAMINAL LUMBAR INTERBODY FUSION, REMOVAL INSTRUMENTATION, REMOVAL SPINAL CORD STIMULATOR    Patient location: PACU    Post pain: Adequate analgesia    Post assessment: no apparent anesthetic complications and tolerated procedure well    Post vital signs:    Filed Vitals:    02/03/14 1631   BP: 133/76   Pulse: 101   Temp: 99 ??F (37.2 ??C)   Resp: 30   SpO2: 99%       Level of consciousness: awake and alert     Complications: None

## 2014-02-03 NOTE — Unmapped (Signed)
LUMBAR 4-5 TRANSFORAMINAL LUMBAR INTERBODY FUSION, REMOVAL INSTRUMENTATION, REMOVAL SPINAL CORD STIMULATOR  Procedure Note    [name]  02/03/2014      Pre-op Diagnosis: lumbar stenosis, degenerative spondylolisthesis L4-5       Post-op Diagnosis: Same    Procedure(s):  LUMBAR 4-5 TRANSFORAMINAL LUMBAR INTERBODY FUSION, REMOVAL INSTRUMENTATION, REMOVAL SPINAL CORD STIMULATOR      Surgeon(s):  Cephus Shelling, MD    Anesthesia: General    Staff:   Circulator: Courtney Paris, RN  Radiology Technologist: Jeananne Rama, RT  Relief Circulator: Nathanial Rancher, RN  Relief Scrub: Renaldo Harrison, CSA  Scrub Person: Leonette Monarch, ST  Resident: Daneil Dolin, MD  Preop Nurse: Sharia Reeve, RN  PCA: Kathryne Sharper    Estimated Blood Loss: 275 mL                 Specimens: * No specimens in log *           Drains:   Drain CWS 400 ml Back (Active)   Number of days:0       Urethral Catheter Non-latex 16 Fr. (Active)   Number of days:0             There were no complications unless listed below.        [name]     Date: 02/03/2014  Time: 4:30 PM

## 2014-02-04 LAB — BASIC METABOLIC PANEL
Anion Gap: 4 mmol/L (ref 3–16)
Anion Gap: 4 mmol/L (ref 3–16)
BUN: 14 mg/dL (ref 7–25)
BUN: 14 mg/dL (ref 7–25)
CO2: 30 mmol/L (ref 21–33)
CO2: 31 mmol/L (ref 21–33)
Calcium: 8.7 mg/dL (ref 8.6–10.3)
Calcium: 8.9 mg/dL (ref 8.6–10.3)
Chloride: 89 mmol/L (ref 98–110)
Chloride: 94 mmol/L (ref 98–110)
Creatinine: 0.8 mg/dL (ref 0.60–1.30)
Creatinine: 0.62 mg/dL (ref 0.60–1.30)
GFR MDRD Af Amer: 118 See note.
GFR MDRD Af Amer: 159 See note.
GFR MDRD Non Af Amer: 98 See note.
GFR MDRD Non Af Amer: 131 See note.
Glucose: 209 mg/dL (ref 70–100)
Glucose: 142 mg/dL (ref 70–100)
Osmolality, Calculated: 263 mOsm/kg (ref 278–305)
Osmolality, Calculated: 271 mOsm/kg (ref 278–305)
Potassium: 4.4 mmol/L (ref 3.5–5.3)
Potassium: 3.7 mmol/L (ref 3.5–5.3)
Sodium: 123 mmol/L (ref 133–146)
Sodium: 129 mmol/L (ref 133–146)

## 2014-02-04 LAB — CBC
Hematocrit: 32.1 % (ref 38.5–50.0)
Hemoglobin: 10.9 g/dL (ref 13.2–17.1)
MCH: 29.6 pg (ref 27.0–33.0)
MCHC: 33.8 g/dL (ref 32.0–36.0)
MCV: 87.5 fL (ref 80.0–100.0)
MPV: 8.6 fL (ref 7.5–11.5)
Platelets: 275 10*3/uL (ref 140–400)
RBC: 3.67 10*6/uL (ref 4.20–5.80)
RDW: 16.5 % (ref 11.0–15.0)
WBC: 16.7 10*3/uL (ref 3.8–10.8)

## 2014-02-04 LAB — CORTISOL: Cortisol: 10.7 ug/dL

## 2014-02-04 LAB — TSH: TSH: 2.06 u[IU]/mL (ref 0.34–5.60)

## 2014-02-04 LAB — T4, FREE: Free T4: 1.2 ng/dL (ref 0.61–1.76)

## 2014-02-04 MED ORDER — docusate sodium (COLACE) 100 MG capsule
100 | ORAL_CAPSULE | Freq: Two times a day (BID) | ORAL | Status: AC
Start: 2014-02-04 — End: 2014-02-19

## 2014-02-04 MED ORDER — oxyCODONE-acetaminophen (PERCOCET) 5-325 mg per tablet
5-325 | ORAL_TABLET | ORAL | Status: AC
Start: 2014-02-04 — End: 2014-02-19

## 2014-02-04 MED FILL — PANTOPRAZOLE 40 MG TABLET,DELAYED RELEASE: 40 40 MG | ORAL | Qty: 1

## 2014-02-04 MED FILL — SENNA-S 8.6 MG-50 MG TABLET: 8.6-50 8.6-50 mg | ORAL | Qty: 1

## 2014-02-04 MED FILL — ZENPEP 20,000 UNIT-68,000 UNIT-109,000 UNIT CAPSULE,DELAYED RELEASE: 20000-68000 20,000-68,000 -109,000 unit | ORAL | Qty: 1

## 2014-02-04 MED FILL — GABAPENTIN 400 MG CAPSULE: 400 400 MG | ORAL | Qty: 2

## 2014-02-04 MED FILL — TYLENOL 325 MG TABLET: 325 325 mg | ORAL | Qty: 2

## 2014-02-04 MED FILL — THERA 400 MCG TABLET: 400 400 mcg | ORAL | Qty: 1

## 2014-02-04 MED FILL — CLINDAMYCIN 600 MG/50 ML IN 5 % DEXTROSE INTRAVENOUS PIGGYBACK: 600 600 mg/50 mL | INTRAVENOUS | Qty: 50

## 2014-02-04 NOTE — Unmapped (Signed)
Problem: Knowledge Deficit  Goal: Patient/family/caregiver demonstrates understanding of disease process, treatment plan, medications, and discharge instructions  Complete learning assessment and assess knowledge base.   Outcome: Progressing  Reviewed plan of care with patient.        Problem: Fall Prevention  Goal: Patient will remain free of falls  Assess and monitor vitals signs, neurological status including level of consciousness and orientation. Reassess fall risk per hospital policy.    Ensure arm band on, uncluttered walking paths in room, adequate room lighting, call light and overbed table within reach, bed in low position, wheels locked, side rails up per policy, and non-skid footwear provided.   Outcome: Progressing  Fall precautions in place.  Patient instructed to call for assistance prior to getting out of bed.  Call light in reach.

## 2014-02-04 NOTE — Unmapped (Signed)
[facility]  Hospitalist Progress Note    Admit Date: 02/03/2014       Name: Arthur Neal  DOB: June 20, 1951  MRN: [id number]  02/04/2014    Overnight Events: noted    History of Present Illness:    CC: F/U for post-op medical management  Interval History: Arthur Neal reports feeling fine.  Post-op pain is well controlled with pain meds.  Denies chest pain or shortness of breath.  No nausea and vomiting and is tolerating oral intake well.   No fever noted.  No headaches.  Reviewed interval ancillary notes.    Review of Systems:    Pertinent Review of Systems - Negative except the ones mentioned above.     Physical Exam:    Temp:  [97.4 ??F (36.3 ??C)-97.9 ??F (36.6 ??C)] 97.9 ??F (36.6 ??C)  Heart Rate:  [80-101] 94  Resp:  [16-18] 16  BP: (103-141)/(65-91) 141/75 mmHg    Intake/Output Summary (Last 24 hours) at 02/04/14 1816  Last data filed at 02/04/14 1510   Gross per 24 hour   Intake   2451 ml   Output   2140 ml   Net    311 ml          BP 141/75   Pulse 94   Temp(Src) 97.9 ??F (36.6 ??C) (Oral)   Resp 16   Ht 5' 8 (1.727 m)   Wt 164 lb 11 oz (74.702 kg)   BMI 25.05 kg/m2   SpO2 99%    General appearance: alert, awake, oriented x 3 ; cooperative and no distress  Head: Normocephalic, without obvious abnormality, atraumatic  Eyes: Anicteric sclera,  PERRL, EOM's intact.   Neck: no JVD, supple  Lungs: clear to auscultation bilaterally and no respiratory distress  Heart: regular rate and rhythm, S1, S2 normal, no murmur, click, rub or gallop  Abdomen: soft, non-tender; bowel sounds normal; no masses,  no organomegaly  Back: hemovac in place with bloody drainage.  Extremities: no edema,cyanosis, clubbing of LLE; prosthesis on LLE  Pulses: 2+ and symmetric  Skin: Warm and dry.   Neurologic: No focalizing signs      Diagnostic Studies:      Recent Labs      02/03/14   1722  02/04/14   1029   WBC   --   16.7*   HGB  12.3*  10.9*   HCT  36.0*  32.1*   PLT   --   275           Recent Labs      02/04/14   1029  02/04/14   1801   NA  123*  129*   K  4.4  3.7   CL  89*  94*   CO2  30  31   BUN  14  14   CREATININE  0.80  0.62   GLUCOSE  209*  142*     Recent Labs      02/03/14   1005   INR  0.9     No results found for this basename: AST, ALT, TOTAL BILIRUBIN,  in the last 72 hours      Assessment & Plan:     1. Lumbar stenosis, degenerative spondylolisthesis s/p L4-5 S/P TLIF L4-5, removal SCS and previous instrumentation - management (pain control ,dvt px, and activity) per primary team   2. 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 Anemia - otherwise he is hemodynamically stable ; will continue to monitor    4. GERD - on proton pump inhibitor   5. History of pancreatitis - on lipase, protease and amylase    Arthur Neal was informed of the results of any tests, a time was given to answer questions, and an agreed upon plan was proposed.      Full Code     Ignacia Bayley, M.D.  Pager No. [phone]   02/04/2014

## 2014-02-04 NOTE — Unmapped (Signed)
Problem: Discharge Planning  Goal: Identify discharge needs  Outcome: Completed Date Met:  02/04/14  Home with spouse, declines HHC needs.
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
## 2014-02-04 NOTE — Unmapped (Signed)
Orthopaedic Progress Note    Length of Stay: Day 1     Subjective:  Arthur Neal is a 63 y.o.  male s/p TLIF L4-5, removal of SCS and instrumentation, POD 1. Doing very well. Leg pain resolved. Surgical back pain. Pain well controlled. Denies CP, HA, N/V, numbness/tingling in legs.   Drain output 110 mL over last 8 hours.    Labs:  Lab Results   Component Value Date    WBC 7.6 05/12/2008    HGB 12.3* 02/03/2014    HCT 36.0* 02/03/2014    MCV 86.4 02/03/2014    PLT 504 H 05/12/2008     No results found for this basename: GLUCOSE, BUN, C02, CREATININE, K, NACLCALCIUM, BCR, PHOS, ALBUMIN     Lab Results   Component Value Date    INR 0.9 02/03/2014    INR 2.0 05/12/2008    PROTIME 11.7 02/03/2014    PROTIME 18.4 05/12/2008       Vitals:  Filed Vitals:    02/03/14 2135 02/03/14 2255 02/04/14 0308 02/04/14 0833   BP: 126/91 127/73 123/74 103/65   Pulse: 92 99 101 80   Temp:  97.4 ??F (36.3 ??C) 97.7 ??F (36.5 ??C) 97.6 ??F (36.4 ??C)   TempSrc:  Oral Oral Oral   Resp: 16 16 16 18    Height:       Weight:       SpO2: 95% 99% 98% 97%      .  Physical Exam:  General: NAD, A&O x 3   MSK: moves LE well, MMT with no deficit, prosthesis on right LE   Neuro: sensation intact to light touch  Vasc: Well perfused    Assessment:  1. S/P TLIF L4-5, removal SCS and previous instrumentation    Plan:  1. Weight bearing status: full  2. Anticoagulation: scd's, ambulate  3. PT/OT  4. Activity: activity as tolerated  5. D/C PCA and foley  6. Upright xray today  7. Possible d/c tomorrow        Jamas Lav

## 2014-02-04 NOTE — Unmapped (Signed)
D: Orders written to dc foley catheter.    A: Foley removed per policy.  Balloon deflated and tip intact.  Pt instructed to call for assistance prior to getting up to bathroom.      R: Pt tolerated well.  Verbalized understanding.  Call light in reach.  Will monitor.

## 2014-02-04 NOTE — Unmapped (Signed)
D:  Foley remove this am by this RN.  Patient request to use bathroom.      A:  Patient assisted to bathroom with standby assistance.  Able to void 500 mL yellow clear urine.  Assisted back to bed.      R:  Denies further questions or concerns.  Call light in reach will monitor.

## 2014-02-04 NOTE — Unmapped (Signed)
---   CASE MANAGEMENT NOTE ---  Met with patient to discuss discharge planning. Introduced self and role of Sports coach, provided contact information.    Type of Home: mobil home, single level  Number of Entry Steps: 4 curblike STE  Patient Lives With: spouse, Cala Bradford  24/7 Supervision/Assistance Available at D/C if needed: spouse available  Level of Activity Prior to Admission: independent  DME Available at Home: SW, cane, WC-electric, Art gallery manager  Home Health Prior to Admission: NA and declines HHC needs    Met with patient and wife at bedside to assess discharge needs. Discussed possible HHC, both decline need. States  Independent prior to admission and does not anticipate needing anything at discharge. Has been instructed to ambulate at discharge and will progress to OP therapy if indicated after follow up appt with MD. Advised patient to contact CM/SW if discharge needs arise. Patient verbalized understanding. Patient/family understand and agree with discharge plan.    --- DISCHARGE PLAN SUMMARY ---  Home with spouse, declines HHC needs.    CM will continue to follow and remain available for any changing discharge needs.  Thurmond Butts, RN, Case Manager, [phone]  Patient/Family aware and taking part in the discharge plan.  Patient/family were offered a post-acute provider list as applicable to the discharge plan and insurance provider.  Patient/family were given the freedom to choose providers and financial interest(s) were disclosed as appropriate.

## 2014-02-04 NOTE — Unmapped (Signed)
Problem: Knowledge Deficit  Goal: Patient/family/caregiver demonstrates understanding of disease process, treatment plan, medications, and discharge instructions  Complete learning assessment and assess knowledge base.   Outcome: Progressing  Reviewed plan of care with patient.        Problem: Fall Prevention  Goal: Patient will remain free of falls  Assess and monitor vitals signs, neurological status including level of consciousness and orientation. Reassess fall risk per hospital policy.    Ensure arm band on, uncluttered walking paths in room, adequate room lighting, call light and overbed table within reach, bed in low position, wheels locked, side rails up per policy, and non-skid footwear provided.   Outcome: Progressing  Fall precautions in place.  Patient instructed to call for assistance prior to getting out of bed.  Call light in reach.        Problem: Pain  Related to disease state or surgical/invasive procedure.  As evidenced by verbal cues, non-verbal cues, elevated BP or HR.   Goal: Report decrease in pain level  Alleviation of pain or a reduction in pain to a level of comfort that is acceptable to the patient.   Outcome: Progressing  Patient denies pain at this time.  Has not required pain medications after PCA discontinued this am.  Reviewed available pain medications including dose, frequency, and possible side effects.  Encouraged patient to keep staff informed of pain and pain control needs.

## 2014-02-05 ENCOUNTER — Inpatient Hospital Stay: Admit: 2014-02-05 | Payer: PRIVATE HEALTH INSURANCE

## 2014-02-05 LAB — BASIC METABOLIC PANEL
Anion Gap: 2 mmol/L (ref 3–16)
BUN: 9 mg/dL (ref 7–25)
CO2: 31 mmol/L (ref 21–33)
Calcium: 8.8 mg/dL (ref 8.6–10.3)
Chloride: 98 mmol/L (ref 98–110)
Creatinine: 0.66 mg/dL (ref 0.60–1.30)
GFR MDRD Af Amer: 148 See note.
GFR MDRD Non Af Amer: 122 See note.
Glucose: 131 mg/dL (ref 70–100)
Osmolality, Calculated: 272 mOsm/kg (ref 278–305)
Potassium: 3.8 mmol/L (ref 3.5–5.3)
Sodium: 131 mmol/L (ref 133–146)

## 2014-02-05 LAB — CBC
Hematocrit: 31.1 % (ref 38.5–50.0)
Hemoglobin: 10.8 g/dL (ref 13.2–17.1)
MCH: 29.8 pg (ref 27.0–33.0)
MCHC: 34.6 g/dL (ref 32.0–36.0)
MCV: 86.2 fL (ref 80.0–100.0)
MPV: 8.4 fL (ref 7.5–11.5)
Platelets: 195 10*3/uL (ref 140–400)
RBC: 3.61 10*6/uL (ref 4.20–5.80)
RDW: 16.5 % (ref 11.0–15.0)
WBC: 9.7 10*3/uL (ref 3.8–10.8)

## 2014-02-05 MED FILL — GABAPENTIN 400 MG CAPSULE: 400 400 MG | ORAL | Qty: 2

## 2014-02-05 MED FILL — ZENPEP 20,000 UNIT-68,000 UNIT-109,000 UNIT CAPSULE,DELAYED RELEASE: 20000-68000 20,000-68,000 -109,000 unit | ORAL | Qty: 1

## 2014-02-05 MED FILL — SENNA-S 8.6 MG-50 MG TABLET: 8.6-50 8.6-50 mg | ORAL | Qty: 1

## 2014-02-05 MED FILL — THERA 400 MCG TABLET: 400 400 mcg | ORAL | Qty: 1

## 2014-02-05 MED FILL — PANTOPRAZOLE 40 MG TABLET,DELAYED RELEASE: 40 40 MG | ORAL | Qty: 1

## 2014-02-05 NOTE — Unmapped (Signed)
Reviewed and in agreement with note.  Sue Noxsel, OTR/L  02/06/2014
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
## 2014-02-05 NOTE — Unmapped (Signed)
Removed hemovac per orders.  Scant bleeding at site upon removal.  Surgical dressing replaced (was attached to hemovac dressing) and gauze and tegaderm dressing placed at drain site.  Reviewed d/c orders and scripts for percocet and colace with pt and wife.  Both verbalized understanding and had no f/u questions.  Pt escorted to lobby in w/c in stable condition to meet his wife for d/c home.  Dilraj Killgore LYNN Darianny Momon

## 2014-02-05 NOTE — Unmapped (Signed)
Patient ID:  Arthur Neal  [id number]  63 y.o.  10/31/1950    Admit date: 02/03/2014    Discharge date and time: 02/05/2014  4:42 PM     Admitting Physician: Tommas Olp, MD    Discharge Physician: Tommas Olp, MD    Admission Diagnoses: Lumbar stenosis [724.02]    Discharge Diagnoses: same as above    Past Medical History   Diagnosis Date   ??? Hypertension    ??? GERD (gastroesophageal reflux disease)    ??? Benign prostatic hyperplasia    ??? Pancreatitis    ??? Arthritis    ??? Back pain    ??? Wears glasses        Procedure: TLIF Lumbar 4-5, removal of previous instrumentation and spinal cord stimulator    Admission Condition: fair    Discharged Condition: good    Indication for Admission: Arthur Neal is a 63 y.o. male with a history of lumbar stenosis and previous fusion. Pt failed conservative treatment to date. Treatment options were discussed with the patient and pt wishes to proceed with surgery.     Hospital Course: Pt was admitted to the hospital for surgery. Pt underwent a TLIF with removal of previous instrumentation and spinal cord stimulator by Dr Sandi Mealy. Procedure was performed without complications. Patient recovered in PACU and then was admitted to Orthopedics. Pain was well controlled with IV & po pain medications. Pt had PT/OT consults and therapy recommended discharge to home. Pt tolerated diet. Remained hemodynamically stable. Drain was pulled postoperatively. Pt was discharged in stable condition.     Consults: hospitalist; PT/OT    Disposition: Home or Self Care WITHOUT Home Care Services    Patient Instructions:   Discharge Medication List as of 02/05/2014  4:02 PM      START taking these medications    Details   docusate sodium (COLACE) 100 MG capsule Take 1 capsule (100 mg total) by mouth 2 times a day., Starting 02/04/2014, Until Discontinued, Print      oxyCODONE-acetaminophen (PERCOCET) 5-325 mg per tablet 1 tab po q 4-6 hours mild to mod pain or   2 tab po q 4-6 hours mod to severe pain, Print            CONTINUE these medications which have NOT CHANGED    Details   gabapentin (NEURONTIN) 800 MG tablet Take 800 mg by mouth 3 (three) times daily.  , Starting 10/29/2008, Until Discontinued, Historical Med      lipase-protease-amylase (ZENPEP DR, 20,000 UNITS OF LIPASE,) 20,000-68,000 -109,000 unit CpDR Take 1 capsule by mouth 3 times a day with meals., Until Discontinued, Historical Med      LUTEIN ORAL Take by mouth., Until Discontinued, Historical Med      multivitamin capsule Take 1 capsule by mouth daily., Until Discontinued, Historical Med      NAPROXEN SODIUM (ALEVE ORAL) Take by mouth., Until Discontinued, Historical Med      pantoprazole (PROTONIX) 40 MG tablet Take 40 mg by mouth every morning before breakfast., Until Discontinued, Historical Med             Activity: activity as tolerated; avoid bending, twisting, pushing, pulling, and heavy lifting.  Diet: regular diet  Wound Care: keep wound clean and dry, covered for showers per patient instructions    Future Appointments  Date Time Provider Department Center   02/19/2014 10:00 AM Cephus Shelling, MD [facility]     No follow-up provider specified.  Signed:  Jamas Lav  02/05/2014  4:58 PM

## 2014-02-05 NOTE — Unmapped (Signed)
[facility]  Hospitalist Progress Note    Admit Date: 02/03/2014       Name: Arthur Neal  DOB: [birth year]  MRN: [id number]  02/05/2014    Overnight Events: noted    History of Present Illness:    CC: F/U for post-op medical management  Interval History: Arthur Neal reports feeling fine.  Post-op pain is well controlled with pain meds.  He denies chest pain or shortness of breath.  No nausea and vomiting and is tolerating oral intake well.   No headaches.  No fever overnight.   Reviewed interval ancillary notes.    Review of Systems:    Pertinent Review of Systems - Negative except the ones mentioned above.     Physical Exam:    Temp:  [97.9 ??F (36.6 ??C)-99 ??F (37.2 ??C)] 97.9 ??F (36.6 ??C)  Heart Rate:  [94-108] 97  Resp:  [16] 16  BP: (111-141)/(64-75) 138/74 mmHg    Intake/Output Summary (Last 24 hours) at 02/05/14 1127  Last data filed at 02/05/14 0800   Gross per 24 hour   Intake   1485 ml   Output   1135 ml   Net    350 ml          BP 138/74   Pulse 97   Temp(Src) 97.9 ??F (36.6 ??C) (Oral)   Resp 16   Ht 5' 8 (1.727 m)   Wt 164 lb 11 oz (74.702 kg)   BMI 25.05 kg/m2   SpO2 97%    General appearance: alert, awake, oriented x 3 ; cooperative and no distress  Lungs: clear to auscultation bilaterally and no respiratory distress  Heart: regular rate and rhythm, S1, S2 normal, no murmur, click, rub or gallop  Abdomen: soft, non-tender; bowel sounds normal; no masses,  no organomegaly  Back: hemovac in place with serosanguinous drainage.  Extremities: no edema,cyanosis, clubbing of LLE; prosthesis on RLE  Pulses: 2+ and symmetric  Skin: Warm and dry.   Neurologic: No focalizing signs      Diagnostic Studies:      Recent Labs      02/03/14   1722  02/04/14   1029  02/05/14   0944   WBC   --   16.7*  9.7   HGB  12.3*  10.9*  10.8*   HCT  36.0*  32.1*  31.1*   PLT   --   275  195                                                                  Recent Labs      02/04/14   1029  02/04/14   1801  02/05/14   0944   NA   123*  129*  131*   K  4.4  3.7  3.8   CL  89*  94*  98   CO2  30  31  31    BUN  14  14  9    CREATININE  0.80  0.62  0.66   GLUCOSE  209*  142*  131*     Recent Labs      02/03/14   1005   INR  0.9     No results found for this basename:  AST, ALT, TOTAL BILIRUBIN,  in the last 72 hours      Assessment & Plan:     1. Lumbar stenosis, degenerative spondylolisthesis s/p L4-5 S/P TLIF L4-5, removal SCS and previous instrumentation POD#1- management (pain control ,dvt px, and activity) per primary team   2. 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 Anemia - h/h stable; he is hemodynamically stable ; will continue to monitor    4. GERD - on proton pump inhibitor   5. History of pancreatitis - on lipase, protease and amylase    Arthur Neal was informed of the results of any tests, a time was given to answer questions, and an agreed upon plan was proposed.  Okay per medical standpoint to discharge patient home      Full Code     Arthur Neal, M.D.  Pager No. [phone]   02/05/2014
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
## 2014-02-05 NOTE — Unmapped (Signed)
I have reviewed pertinent labs, imaging, progress and consultation notes from this encounter. No addendums, I attest to the accuracy of the discharge summary.

## 2014-02-05 NOTE — Unmapped (Signed)
Problem: PHASE 3  Patients who are in an acute/subacute phase with multiple medical problems, able to actively participate with mobility, still weak but able to tolerate increased activity levels.   Goal: Continue Mobilization of patient, progress to Phase 4  Progressing:    Patient is able to move leg against gravity, follows commands, and actively participates in activities   Outcome: Progressing  Patient ambulating with standby assist to bathroom to void on commode.  Patient demonstrating ability to exit and return to bed.    Problem: PHASE 4  Patients who are in a subacute phase, able to actively participate with mobility, working on mobility indepence and hospital discharge.   Goal: Continue mobilization of patient, progress to Phase 5  Progressing:    Patient is able to move all extremeties against gravity, follow commands, and actively participate in activities.   Outcome: Progressing  .        Problem: PHASE 5  Patients who are in a subacute phase, able to actively participate with mobility, working on mobility independence and hospital discharge.   Goal: Continue mobilization of patient to reach mobility independe  Progressing:    Patient is able to move all extremeties against gravity, follow commands, and actively participate in activities.   Outcome: Progressing  .

## 2014-02-17 NOTE — Unmapped (Signed)
[place]                              [facility]     PATIENT NAME:   Arthur Neal, Arthur Neal                 MRN: [id number]  DATE OF BIRTH:  09-18-1950                     CSN: [id number]  SURGEON:        Cephus Shelling, M.D.           ADMIT DATE: 02/03/2014  SERVICE:        Orthopaedic Surgery and Sports Medi  DICTATED BY:    Cephus Shelling, M.D.           SURGERY DATE: 02/03/2014                                    OPERATIVE REPORT     PREOPERATIVE DIAGNOSIS(ES):  1.  Degenerative spondylolisthesis and spinal stenosis at L4-5.  2.  Previous L5-S1 fusion and instrumentation.     POSTOPERATIVE DIAGNOSIS(ES):  1.  Degenerative spondylolisthesis and spinal stenosis at L4-5.  2.  Previous L5-S1 fusion and instrumentation.     PROCEDURE(S) PERFORMED:  1.  Transforaminal lumbar interbody fusion L4-L5.  2.  Posterolateral fusion and instrumentation at L4-L5.  3.  Decompressive lumbar laminectomy of L4 with bilateral foraminotomies at  L4-L5.  4.  Insertion of a PEEK cage, size 11 mm at L4-L5.  5.  Removal of instrumentation at L5-S1 including screws and rods.  6.  Exploration of spinal fusion at L5-S1.  7.  Use of cancellous allograft, local autograft, demineralized bone matrix.     SURGEON:  Cephus Shelling, M.D.     ANESTHESIA:  General endotracheal.     COMPLICATIONS:  None.     ESTIMATED BLOOD LOSS:  400 cc.     INSTRUMENTATION USED:  [employer] Programmer, multimedia.     INDICATION(S):  The patient is a 63 year old gentleman who has had  longstanding history of right lower extremity radicular pain that has not  responded to conservative measures.  Therefore, he wished to proceed with  surgery.  Informed consent was obtained regarding risks and benefits of the  operation.     DETAILS OF PROCEDURE(S):  The patient was brought to the OR and after general  anesthesia was induced spinal cord monitoring including SSEP and EMG leads  were placed in bilateral upper and lower extremities.  The  patient was turned  over to the prone position on the [place] table.  All bony prominences were  well-padded.  A timeout was call confirming informed consent documentation.  Preoperative antibiotics were administered as per protocol.     Next, I made a linear incision exposing the posterior elements at L5-S1 and  L4-L5.  First attention was directed to removing the set screws and the rods  at L5-S1.  The pedicle screws were then removed.  I worked laterally to  expose the L5 transverse process and the sacral ala.  There was noted to be  solid fusion of these areas.  Next, I performed a laminectomy of L4  decompressing the thecal sac in the central canal as well as doing a lateral  recesses in the foramina bilaterally.  After decompression was completed,  pedicle screws were placed bilaterally at L4 and L5.  These pedicle screws  were placed in standard freehand fashion and checked with fluoroscopy to  ensure good positioning.  Next, attention was directed to performing the  interbody fusion.  I gently retracted the thecal sac.  The underlying disc  was exposed.  Discectomy was then performed using a combination of  pituitaries and curettes.  The endplates were prepared.  Bone graft was  packed into the disc space followed by insertion of an 11 mm PEEK cage.  Good  fit and stability was obtained with the cage.  The wound was thoroughly  irrigated with pulsatile lavage.  Fluoroscopy shots were obtained confirming  good position of all the implants.  The transverse processes of L4 and the  fusion mass at L5 was then exposed and decorticated and bone graft was packed  into the posterolateral area for a fusion.  Rods were placed, set screws were  engaged and final tightening was performed.  The wound was then closed in a  standard fashion in layers over a drain.  Sterile dressings were applied.  The patient was transferred back onto his stretcher and taken to the PACU for  recovery.  There were no complications.  I was  the attending surgeon.  I was  present for the key and critical portions of the case.                                            Cephus Shelling, M.D.  SA/dla  D:  02/17/2014 10:20  T:  02/17/2014 20:17  Job #:  [id number]           OPERATIVE REPORT                                             PAGE    1 of   1

## 2014-02-19 ENCOUNTER — Inpatient Hospital Stay: Admit: 2014-02-19 | Payer: PRIVATE HEALTH INSURANCE

## 2014-02-19 ENCOUNTER — Ambulatory Visit: Admit: 2014-02-19 | Discharge: 2014-02-19 | Payer: PRIVATE HEALTH INSURANCE

## 2014-02-19 DIAGNOSIS — M545 Low back pain, unspecified: Secondary | ICD-10-CM

## 2014-02-19 DIAGNOSIS — Z4889 Encounter for other specified surgical aftercare: Secondary | ICD-10-CM

## 2014-02-19 NOTE — Unmapped (Signed)
CC: Initial post op evaluation    HPI: Arthur Neal is a 63 y.o. male who presents to clinic today following lumbar TLIF L4-5. He has done very well following surgery. Complains only of mild pain at incision. Takes no narcotics. Still having some tingling at night at end of right leg stump. Taking neurontin chronically,     ROS: All others negative except for as stated in HPI    Past Medical History   Diagnosis Date   ??? Hypertension    ??? GERD (gastroesophageal reflux disease)    ??? Benign prostatic hyperplasia    ??? Pancreatitis    ??? Arthritis    ??? Back pain    ??? Wears glasses        Past Surgical History   Procedure Laterality Date   ??? Cervical laminectomy       C5-S1 diskectomy   ??? Amputation       Traumatic   ??? Incision and drainage  02/2008     s/p I&D, and revision and grafting   ??? Appendectomy     ??? Ct upper extremity left w and wo contrast  2004     2014   ??? Hernia repair     ??? Skin graft  2014     2009   ??? Tendon repair Left      tendon & nerve repair left hand   ??? Transforaminal lumbar interbody fusion N/A 02/03/2014     Procedure: LUMBAR 4-5 TRANSFORAMINAL LUMBAR INTERBODY FUSION, REMOVAL INSTRUMENTATION, REMOVAL SPINAL CORD STIMULATOR;  Surgeon: Cephus Shelling, MD;  Location: [facility] OR;  Service: Ortho Spine;  Laterality: N/A;       History     Social History   ??? Marital Status: Married     Spouse Name: N/A     Number of Children: N/A   ??? Years of Education: N/A     Occupational History   ??? Not on file.     Social History Main Topics   ??? Smoking status: Never Smoker    ??? Smokeless tobacco: Never Used      Comment: 04/23/2008   ??? Alcohol Use: Yes      Comment: 04/23/2008, occass   ??? Drug Use: No      Comment: 04/23/2008   ??? Sexual Activity: Yes     Partners: Female     Pharmacist, hospital Protection: None     Other Topics Concern   ??? Not on file     Social History Narrative   ??? No narrative on file       Allergies   Allergen Reactions   ??? Ace Inhibitors    ??? Ambien [Zolpidem]    ??? Amitriptyline Hcl    ??? Cephalosporins  Other (See Comments)     Generalize rash/confusion   ??? Fish Containing Products    ??? Iodine And Iodide Containing Products    ??? Zolpidem Tartrate    ??? Keflex [Cephalexin] Rash       Current Outpatient Prescriptions on File Prior to Visit   Medication Sig Dispense Refill   ??? gabapentin (NEURONTIN) 800 MG tablet Take 800 mg by mouth 3 (three) times daily.         ??? lipase-protease-amylase (ZENPEP DR, 20,000 UNITS OF LIPASE,) 20,000-68,000 -109,000 unit CpDR Take 1 capsule by mouth 3 times a day with meals.       ??? LUTEIN ORAL Take by mouth.       ??? multivitamin capsule Take  1 capsule by mouth daily.       ??? NAPROXEN SODIUM (ALEVE ORAL) Take by mouth.       ??? pantoprazole (PROTONIX) 40 MG tablet Take 40 mg by mouth every morning before breakfast.       ??? [DISCONTINUED] docusate sodium (COLACE) 100 MG capsule Take 1 capsule (100 mg total) by mouth 2 times a day.  60 capsule  0   ??? [DISCONTINUED] oxyCODONE-acetaminophen (PERCOCET) 5-325 mg per tablet 1 tab po q 4-6 hours mild to mod pain or   2 tab po q 4-6 hours mod to severe pain  80 tablet  0     No current facility-administered medications on file prior to visit.         Physical Exam:     Filed Vitals:    02/19/14 0951   Height: 5' 8 (1.727 m)   Weight: 165 lb (74.844 kg)       General: No distress, Alert & Oriented X 4 and Well-nourished  CV: RRR  Resp: CTAB  Spine: incision well healed, neuro exam stable      Assessment  63 y.o. male, s/p lumbar microdiscectomy. Symptoms improved/improving. Incision has healed well.     Plan  Recommend decreasing narcotics and using OTC tylenol/anti-iflammatories as needed. Increase activities as tolerated.  Return to work/sports: no  Referral to physical therapy: no  Follow up 6-8 weeks with xray    All patients questions were answered to satisfaction.    Dr. Sandi Mealy was present and participated in all key portions of this exam.

## 2014-02-19 NOTE — Unmapped (Signed)
This office note has been dictated.

## 2014-02-26 ENCOUNTER — Encounter

## 2014-03-25 ENCOUNTER — Ambulatory Visit: Admit: 2014-03-25 | Discharge: 2014-03-25 | Payer: PRIVATE HEALTH INSURANCE

## 2014-03-25 ENCOUNTER — Inpatient Hospital Stay: Admit: 2014-03-25 | Payer: PRIVATE HEALTH INSURANCE

## 2014-03-25 DIAGNOSIS — M48061 Spinal stenosis, lumbar region without neurogenic claudication: Secondary | ICD-10-CM

## 2014-03-25 DIAGNOSIS — M545 Low back pain, unspecified: Secondary | ICD-10-CM

## 2014-03-25 MED ORDER — predniSONE (DELTASONE) 10 MG tablet
10 | ORAL_TABLET | Freq: Every day | ORAL | Status: AC
Start: 2014-03-25 — End: ?

## 2014-03-25 NOTE — Unmapped (Signed)
This office note has been dictated.

## 2014-03-31 NOTE — Unmapped (Signed)
[facility]     PATIENT NAME:  Arthur Neal, Arthur Neal                    MRN:  [id number]  DATE OF BIRTH:  [birth year]                       CSN:  [id number]  PROVIDER:  Cephus Shelling, M.D.                  VISIT DATE:  03/25/2014                                       OFFICE NOTE     The patient is here for follow-up.  He is almost two months out from a lumbar  fusion at L4-L5.  He previously had right leg pain. Now, he has left-sided  low back and left buttock pain.  The pain does not radiate any lower than his  left buttock area.  This started two weeks ago. He is not sure what started  it.     On exam today, his incision is healed.  Neurologically, he is stable.  Straight leg raise on the left side causes back pain.  Sensation is intact.     Radiographs are stable.  Implants are in good position.     ASSESSMENT AND PLAN: I started him on a prednisone taper. I am getting him  into physical therapy. If no improvement, he may need an MRI of his lumbar  spine.  This may also be SI joint related, so we will see how he responds to  the above. Last resort will be to try an SI joint injection.                                              Cephus Shelling, M.D.  SA/tlm  D:  03/31/2014 15:15  T:  04/02/2014 14:34  Job #:  [id number]           OFFICE NOTE                                                  PAGE    1 of   1

## 2014-04-02 ENCOUNTER — Encounter

## 2014-04-07 NOTE — Unmapped (Signed)
Denise sent a message about this too. Dr. Sandi Mealy does not want to give more steroids, too much. Recommended narcotics if he will take them. Percocet 1-2 po q 6 #60

## 2014-04-07 NOTE — Unmapped (Signed)
Pt called, is going out of town tomorrow and is requesting a refill for the steroid pack. Please advise    Last Seen:03/25/14    ASSESSMENT AND PLAN: I started him on a prednisone taper. I am getting him   into physical therapy. If no improvement, he may need an MRI of his lumbar   spine. This may also be SI joint related, so we will see how he responds to   the above. Last resort will be to try an SI joint injection.

## 2014-04-07 NOTE — Unmapped (Signed)
[facility] in [place].

## 2014-04-07 NOTE — Unmapped (Signed)
Called to let her know we called in Tramadol to their pharmacy on 04/07/14.

## 2014-04-07 NOTE — Unmapped (Signed)
Message copied by Cherlynn Polo on Mon Apr 07, 2014  6:07 PM  ------       Message from: Hillery Hunter       Created: Mon Apr 07, 2014  5:04 PM         I just spoke with Dr. Sandi Mealy about him. That is too much steroid to repeat. Can give him some pain medications, I know he doesn't take much.              Percocet 1-2 po q 6 hours #60              ----- Message -----          From: Cherlynn Polo, MA          Sent: 04/07/2014   4:10 PM            To: Jamas Lav, PA              Any advise on medication regarding regie bunner.              He is looking for a steroid pack.              Please advise.         ------

## 2014-04-08 NOTE — Unmapped (Signed)
Per other message, tramadol called in to pharmacy, pt wife aware

## 2014-04-09 NOTE — Unmapped (Signed)
Pt is scheduled for mri l spine wo 04/10/14 at 3:00pm open sided mri . Berkley Harvey #[phone]. Lm with patient to get cd and details.

## 2014-04-10 NOTE — Unmapped (Signed)
Pt is scheduled for mri l spine wo 04/16/14 at 1:00pm albert  . Berkley Harvey #[id number]. Lm with patient to get cd and details.

## 2014-04-16 ENCOUNTER — Encounter

## 2014-04-16 ENCOUNTER — Inpatient Hospital Stay: Admit: 2014-04-16 | Payer: PRIVATE HEALTH INSURANCE

## 2014-04-16 DIAGNOSIS — Z4789 Encounter for other orthopedic aftercare: Secondary | ICD-10-CM

## 2014-04-23 ENCOUNTER — Inpatient Hospital Stay: Admit: 2014-04-23 | Payer: PRIVATE HEALTH INSURANCE

## 2014-04-23 ENCOUNTER — Ambulatory Visit: Admit: 2014-04-23 | Discharge: 2014-04-23 | Payer: PRIVATE HEALTH INSURANCE

## 2014-04-23 DIAGNOSIS — M47817 Spondylosis without myelopathy or radiculopathy, lumbosacral region: Secondary | ICD-10-CM

## 2014-04-23 DIAGNOSIS — M48061 Spinal stenosis, lumbar region without neurogenic claudication: Secondary | ICD-10-CM

## 2014-04-23 MED ORDER — HYDROcodone-acetaminophen (NORCO) 5-325 mg per tablet
5-325 | ORAL_TABLET | Freq: Two times a day (BID) | ORAL | 0.00 refills | 15.50000 days | Status: AC | PRN
Start: 2014-04-23 — End: 2018-03-05

## 2014-04-23 NOTE — Unmapped (Signed)
This office note has been dictated.

## 2014-04-23 NOTE — Unmapped (Signed)
[facility] AND SPORTS MEDICINE     PATIENT NAME:  Arthur Neal, Arthur Neal                    MRN:  [id number]  DATE OF BIRTH:  1951/05/04                       CSN:  [id number]  PROVIDER:  Cephus Shelling, M.D.                  VISIT DATE:  04/23/2014                                       OFFICE NOTE     REASON FOR VISIT:  Follow-up, L4 to L5 posterior spinal fusion with interbody  fusion on February 01, 2014.     SUBJECTIVE:  Mr. Bouse is a 63 year old gentleman who underwent an L4-L5  posterior spinal fusion with interbody fusion on February 01, 2014.  He was doing  well until he last saw Dr. Sandi Mealy on March 25, 2014.  At that time he was  having intense left leg pain.  Prior to surgery he had been having right leg  pain.  Surgery helped him with his right leg, but now he has worsening left  leg pain.  Dr. Sandi Mealy started him on a Medrol Dosepak to help with this.  He  also ordered MRI.  Patient is here today claiming that his left leg pain is  essentially resolved.  The Medrol Dosepak helped it immensely.  He now  complains of mostly right low back pain.  He did undergo his MRI.  Otherwise,  the patient feels well.  He takes only Aleve for his pain, for which it helps.     PHYSICAL EXAMINATION:  GENERAL APPEARANCE:  No acute distress.  EXTREMITIES:  Bilateral lower extremities with 5/5 motor strength in all  motor units tested.  Sensation is intact to light touch to L2 to S1 nerve  distribution.  BACK:  Regarding his lower posterior back, his incision is well healed.  He  had a prior right lateral incision from his old _____ stimulator.  No  erythema or drainage.     IMAGING:  X-rays taken today, upright, reveals stable alignment of the  hardware in L-spine.  MRI from this month shows adequate decompression at the  L4-L5 levels; however, there is some stenosis of the level above.     ASSESSMENT:  1. Status post L4-L5 posterior spinal fusion with interbody  fusion on February 01, 2014.  2. Bilateral lower extremity symptoms resolved after Medrol Dosepak, now     having right lower back pain.     PLAN:  Dr. Sandi Mealy did explain to the patient that he is likely having  muscular back pain.  Given that the patient has had multiple surgeries on his  lower back, it is likely due to the scarring and fatigue of the muscles  causing his pain.  Dr. Sandi Mealy did tell the patient that he should start  physical therapy for back strengthening exercises.  If this does not help, he  may get trigger point injections in the area of the pain, which is mostly on  the lateral aspect of  the right lower back.  We will also give him a  prescription for Norco for him to take 1 tablet every 12 hours, 40 pills.  After that, we will not refill his narcotic pain medicine.  The patient was  well aware of this and was okay with that.  The patient will follow back  roughly in 1 to 2 months for repeat x-rays.     Dr. Sandi Mealy was present for the entire history and examination of this  patient.     Dictated by Zachery Dauer, M.D.                                                 Cephus Shelling, M.D.  SA/nwc  D:  04/23/2014 12:04  T:  04/25/2014 11:36  Job #:  [id number]           OFFICE NOTE                                                  PAGE    1 of   1

## 2015-04-22 ENCOUNTER — Encounter

## 2018-01-31 ENCOUNTER — Ambulatory Visit: Admit: 2018-01-31 | Discharge: 2018-01-31 | Payer: Medicare (Managed Care)

## 2018-01-31 ENCOUNTER — Inpatient Hospital Stay: Admit: 2018-01-31 | Payer: Medicare (Managed Care)

## 2018-01-31 DIAGNOSIS — R52 Pain, unspecified: Secondary | ICD-10-CM

## 2018-01-31 DIAGNOSIS — M25572 Pain in left ankle and joints of left foot: Secondary | ICD-10-CM

## 2018-01-31 NOTE — Progress Notes (Signed)
This office note has been dictated.
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
## 2018-02-23 NOTE — Unmapped (Signed)
02/06/18 0001   Pre-op Phone Call   Surgery Time Verified Yes  (surgery 03/05/18 at 0730)   Arrival Time Verified 0530   Surgery Location Verified Yes  [facility] 2nd floor diagnostic center)   Remind patient to bring picture ID and insurance card Yes   Medical History Reviewed No   NPO Status Reinforced Yes   Ride and Caregiver Arranged Yes   Patient Knows to Bring Current Medications (list only)   I left Pre op instructions on the patient's voicemail with a phone # for questions.  NPO after midnight on 03/04/18 except he can take his levothyroxine, pantoprazole, prednisone (if still taking) & pain medicine if needed am of OR with a sip of water. Avoid the use of Aspirin, ibuprofen & Aleve (NSAIDS), Vitamin E & fish oil, therefore stop Naproxen & MVI. Tylenol is ok.

## 2018-03-05 MED ORDER — rocuronium (ZEMURON) injection
10 | INTRAVENOUS | Status: AC | PRN
Start: 2018-03-05 — End: 2018-03-05
  Administered 2018-03-05 (×2): 10 via INTRAVENOUS

## 2018-03-05 MED ORDER — glycopyrrolate (ROBINUL) injection
0.2 | INTRAMUSCULAR | Status: AC | PRN
Start: 2018-03-05 — End: 2018-03-05
  Administered 2018-03-05: 12:00:00 .4 via INTRAVENOUS

## 2018-03-05 MED ORDER — dexamethasone (DECADRON) 4 mg/mL injection
4 | INTRAMUSCULAR | Status: AC
Start: 2018-03-05 — End: ?

## 2018-03-05 MED ORDER — midazolam (PF) (VERSED) injection
1 | INTRAMUSCULAR | Status: AC | PRN
Start: 2018-03-05 — End: 2018-03-05
  Administered 2018-03-05 (×2): 1 via INTRAVENOUS

## 2018-03-05 MED ORDER — celecoxib (CELEBREX) capsule 200 mg
200 | Freq: Once | ORAL | Status: AC
Start: 2018-03-05 — End: 2018-03-05

## 2018-03-05 MED ORDER — dexamethasone (DECADRON) injection
4 | INTRAMUSCULAR | Status: AC | PRN
Start: 2018-03-05 — End: 2018-03-05
  Administered 2018-03-05: 12:00:00 8 via INTRAVENOUS

## 2018-03-05 MED ORDER — acetaminophen (TYLENOL) tablet
325 | ORAL | Status: AC | PRN
Start: 2018-03-05 — End: 2018-03-05
  Administered 2018-03-05: 11:00:00 975 via ORAL

## 2018-03-05 MED ORDER — propofol 10 mg/ml (DIPRIVAN) 10 mg/mL injection
10 | INTRAVENOUS | Status: AC
Start: 2018-03-05 — End: ?

## 2018-03-05 MED ORDER — fentaNYL (SUBLIMAZE) 50 mcg/mL injection
50 | INTRAMUSCULAR | Status: AC
Start: 2018-03-05 — End: ?

## 2018-03-05 MED ORDER — fentaNYL (SUBLIMAZE) injection 12.5 mcg
50 | INTRAMUSCULAR | Status: AC | PRN
Start: 2018-03-05 — End: 2018-03-05

## 2018-03-05 MED ORDER — oxyCODONE (ROXICODONE) immediate release tablet 5 mg
5 | Freq: Once | ORAL | Status: AC | PRN
Start: 2018-03-05 — End: 2018-03-05

## 2018-03-05 MED ORDER — phenylephrine (NEO-SYNEPHRINE) injection
10 | INTRAMUSCULAR | Status: AC | PRN
Start: 2018-03-05 — End: 2018-03-05
  Administered 2018-03-05: 12:00:00 100 via INTRAVENOUS

## 2018-03-05 MED ORDER — morphine injection Syrg 2 mg
2 | INTRAVENOUS | Status: AC | PRN
Start: 2018-03-05 — End: 2018-03-05

## 2018-03-05 MED ORDER — lidocaine (PF) 2% (20 mg/mL) 20 mg/mL (2 %) Soln
20 | INTRAMUSCULAR | Status: AC
Start: 2018-03-05 — End: ?

## 2018-03-05 MED ORDER — ondansetron (ZOFRAN) injection
4 | INTRAMUSCULAR | Status: AC | PRN
Start: 2018-03-05 — End: 2018-03-05
  Administered 2018-03-05: 11:00:00 4 via INTRAVENOUS

## 2018-03-05 MED ORDER — acetaminophen (TYLENOL) 325 MG tablet
325 | ORAL | Status: AC
Start: 2018-03-05 — End: 2018-03-05

## 2018-03-05 MED ORDER — fentaNYL (SUBLIMAZE) injection 50 mcg
50 | INTRAMUSCULAR | Status: AC | PRN
Start: 2018-03-05 — End: 2018-03-05

## 2018-03-05 MED ORDER — famotidine (PF) (PEPCID) injection
20 | INTRAVENOUS | Status: AC | PRN
Start: 2018-03-05 — End: 2018-03-05
  Administered 2018-03-05: 11:00:00 20 via INTRAVENOUS

## 2018-03-05 MED ORDER — famotidine (PF) (PEPCID) 20 mg/2 mL injection
20 | INTRAVENOUS | Status: AC
Start: 2018-03-05 — End: ?

## 2018-03-05 MED ORDER — clindamycin (CLEOCIN) IVPB 900 mg
900 | INTRAVENOUS | Status: AC | PRN
Start: 2018-03-05 — End: 2018-03-05

## 2018-03-05 MED ORDER — morphine injection 4 mg
4 | INTRAVENOUS | Status: AC | PRN
Start: 2018-03-05 — End: 2018-03-05

## 2018-03-05 MED ORDER — neostigmine methylsulfate (PROSTIGMIN) IV solution
1 | INTRAVENOUS | Status: AC | PRN
Start: 2018-03-05 — End: 2018-03-05
  Administered 2018-03-05: 12:00:00 3 via INTRAVENOUS

## 2018-03-05 MED ORDER — ketorolac (TORADOL) injection 30 mg
30 | INTRAMUSCULAR | Status: AC | PRN
Start: 2018-03-05 — End: 2018-03-05
  Administered 2018-03-05: 12:00:00 30 via INTRAVENOUS

## 2018-03-05 MED ORDER — lactated Ringers infusion
INTRAVENOUS | Status: AC
Start: 2018-03-05 — End: 2018-03-05
  Administered 2018-03-05: 11:00:00 via INTRAVENOUS

## 2018-03-05 MED ORDER — sodium chloride, irrigation 0.9 % irrigation
0.9 | Status: AC | PRN
Start: 2018-03-05 — End: 2018-03-05
  Administered 2018-03-05: 12:00:00 1000

## 2018-03-05 MED ORDER — succinylcholine (QUELICIN) injection
20 | INTRAMUSCULAR | Status: AC | PRN
Start: 2018-03-05 — End: 2018-03-05
  Administered 2018-03-05: 12:00:00 140 via INTRAVENOUS

## 2018-03-05 MED ORDER — bupivacaine (PF)(SENSORCAINE/MARCAINE) 0.5% injection
0.5 | INTRAMUSCULAR | Status: AC | PRN
Start: 2018-03-05 — End: 2018-03-05
  Administered 2018-03-05: 12:00:00 10 via INTRAMUSCULAR

## 2018-03-05 MED ORDER — fentaNYL (SUBLIMAZE) injection 6.5 mcg
50 | INTRAMUSCULAR | Status: AC | PRN
Start: 2018-03-05 — End: 2018-03-05

## 2018-03-05 MED ORDER — ondansetron (ZOFRAN) injection 4 mg
4 | Freq: Three times a day (TID) | INTRAMUSCULAR | Status: AC | PRN
Start: 2018-03-05 — End: 2018-03-05

## 2018-03-05 MED ORDER — succinylcholine (QUELICIN) 20 mg/mL injection
20 | INTRAMUSCULAR | Status: AC
Start: 2018-03-05 — End: ?

## 2018-03-05 MED ORDER — oxyCODONE-acetaminophen (PERCOCET) 5-325 mg per tablet
5-325 | ORAL_TABLET | Freq: Four times a day (QID) | ORAL | 0 refills | Status: AC | PRN
Start: 2018-03-05 — End: 2018-03-12

## 2018-03-05 MED ORDER — peppermint oil liquid 1 mL
Status: AC | PRN
Start: 2018-03-05 — End: 2018-03-05

## 2018-03-05 MED ORDER — propofol 10 mg/ml (DIPRIVAN) injection
10 | INTRAVENOUS | Status: AC | PRN
Start: 2018-03-05 — End: 2018-03-05
  Administered 2018-03-05: 12:00:00 200 via INTRAVENOUS

## 2018-03-05 MED ORDER — morphine injection 6 mg
4 | INTRAVENOUS | Status: AC | PRN
Start: 2018-03-05 — End: 2018-03-05

## 2018-03-05 MED ORDER — lidocaine (PF) 2% (20 mg/mL) Soln
20 | INTRAMUSCULAR | Status: AC | PRN
Start: 2018-03-05 — End: 2018-03-05
  Administered 2018-03-05: 12:00:00 80 via INTRAVENOUS

## 2018-03-05 MED ORDER — famotidinePFPEPCID20mg2mLinjection
20 | INTRAVENOUS | Status: AC
Start: 2018-03-05 — End: ?

## 2018-03-05 MED ORDER — rocuronium (ZEMURON) 10 mg/mL injection
10 | INTRAVENOUS | Status: AC
Start: 2018-03-05 — End: ?

## 2018-03-05 MED ORDER — proMETHazine (PHENERGAN) injection 6.25 mg
25 | Freq: Four times a day (QID) | INTRAMUSCULAR | Status: AC | PRN
Start: 2018-03-05 — End: 2018-03-05

## 2018-03-05 MED ORDER — fentaNYL (SUBLIMAZE) injection 25 mcg
50 | INTRAMUSCULAR | Status: AC | PRN
Start: 2018-03-05 — End: 2018-03-05

## 2018-03-05 MED ORDER — oxyCODONE (ROXICODONE) immediate release tablet 10 mg
5 | Freq: Once | ORAL | Status: AC | PRN
Start: 2018-03-05 — End: 2018-03-05

## 2018-03-05 MED ORDER — ondansetron (ZOFRAN) 4 mg/2 mL injection
4 | INTRAMUSCULAR | Status: AC
Start: 2018-03-05 — End: ?

## 2018-03-05 MED ORDER — acetaminophen (TYLENOL) tablet 650 mg
325 | Freq: Once | ORAL | Status: AC
Start: 2018-03-05 — End: 2018-03-05

## 2018-03-05 MED ORDER — midazolam (PF) (VERSED) 1 mg/mL injection
1 | INTRAMUSCULAR | Status: AC
Start: 2018-03-05 — End: ?

## 2018-03-05 MED ORDER — naloxone (NARCAN) injection 0.04 mg
0.4 | INTRAMUSCULAR | Status: AC | PRN
Start: 2018-03-05 — End: 2018-03-05

## 2018-03-05 MED ORDER — fentaNYL (SUBLIMAZE) injection
50 | INTRAMUSCULAR | Status: AC | PRN
Start: 2018-03-05 — End: 2018-03-05
  Administered 2018-03-05: 12:00:00 100 via INTRAVENOUS

## 2018-03-05 MED FILL — ROCURONIUM 10 MG/ML INTRAVENOUS SOLUTION: 10 10 mg/mL | INTRAVENOUS | Qty: 5

## 2018-03-05 MED FILL — ANECTINE 20 MG/ML INJECTION SOLUTION: 20 20 mg/mL | INTRAMUSCULAR | Qty: 10

## 2018-03-05 MED FILL — PROPOFOL 10 MG/ML INTRAVENOUS EMULSION: 10 10 mg/mL | INTRAVENOUS | Qty: 20

## 2018-03-05 MED FILL — CLINDAMYCIN 900 MG/50 ML IN 5 % DEXTROSE INTRAVENOUS PIGGYBACK: 900 900 mg/50 mL | INTRAVENOUS | Qty: 50

## 2018-03-05 MED FILL — TYLENOL 325 MG TABLET: 325 325 mg | ORAL | Qty: 3

## 2018-03-05 MED FILL — MIDAZOLAM (PF) 1 MG/ML INJECTION SOLUTION: 1 1 mg/mL | INTRAMUSCULAR | Qty: 2

## 2018-03-05 MED FILL — DEXAMETHASONE SODIUM PHOSPHATE 4 MG/ML INJECTION SOLUTION: 4 4 mg/mL | INTRAMUSCULAR | Qty: 1

## 2018-03-05 MED FILL — FAMOTIDINE (PF) 20 MG/2 ML INTRAVENOUS SOLUTION: 20 20 mg/2 mL | INTRAVENOUS | Qty: 2

## 2018-03-05 MED FILL — XYLOCAINE-MPF 20 MG/ML (2 %) INJECTION SOLUTION: 20 20 mg/mL (2 %) | INTRAMUSCULAR | Qty: 5

## 2018-03-05 MED FILL — FENTANYL (PF) 50 MCG/ML INJECTION SOLUTION: 50 50 mcg/mL | INTRAMUSCULAR | Qty: 2

## 2018-03-05 MED FILL — ONDANSETRON HCL (PF) 4 MG/2 ML INJECTION SOLUTION: 4 4 mg/2 mL | INTRAMUSCULAR | Qty: 2

## 2018-03-05 NOTE — Other (Signed)
Anesthesia Extubation Criteria:    Airway Device: endotracheal tube    Emergence Details:      Smooth      _x_      Stormy       __       Prolonged   __     Extubation Criteria:      Motor strength intact       _x_      Follows commands        _x_      Good airway reflexes      _x_      OP suctioned                  _x_        Follows commands:  Yes     Patient extubated:  Yes

## 2018-03-05 NOTE — Other (Signed)
INTRA-OP POST BRIEFING NOTE: Arthur Neal      Specimens:     Prior to leaving the room: Nurse confirmed name of procedure, completion of instrument, sponge & needle counts, reads specimen labels aloud including patient name and addresses any equipment issues? Nurse confirmed wound class. Nurse to surgeon and anesthesia: What are key concerns for recovery and management of the patient?  Yes      Blood products stored at appropriate temperatures prior to return to blood bank (if applicable)? N/A      Patient identification band secured on patient prior to transfer out of the operating room? Yes    Temporary devices implanted for the duration of the surgery removed and evaluated for intactness and completeness prior to closure? N/A      Other Comments:     Signed: Rhyann Berton A Karrie Fluellen    Date: 03/05/2018    Time: 8:28 AM

## 2018-03-05 NOTE — TOC Discharge Planning (AHS/AVS) (Signed)
Anesthesia Transfer of Care Note    Patient: Arthur Neal  Procedure(s) Performed: Procedure(s):  RELEASE LEFT COMMON PERONEAL NERVE    Patient location: PACU    Anesthesia type: general endotracheal    Airway Device on Arrival to PACU/ICU: Nasal Cannula    IV Access: Peripheral    Monitors Recommended to be Used During PACU/ICU: Standard Monitors    Outstanding Issues to Address: None    Level of Consciousness: awake, alert  and oriented    Post vital signs:    Vitals:    03/05/18 0843   BP: 125/79   Pulse: 72   Resp: 8   Temp: 97.4 F (36.3 C)   SpO2: 100%       Complications: None      Date 03/04/18 0700 - 03/05/18 0659(Not Admitted) 03/05/18 0700 - 03/06/18 0659   Shift 0700-1459 1500-2259 2300-0659 24 Hour Total 0700-1459 1500-2259 2300-0659 24 Hour Total   I  N  T  A  K  E   I.V.     900  (12.2)   900  (12.2)      Volume (mL) (lactated Ringers infusion)     900   900    Shift Total  (mL/kg)     900  (12.2)   900  (12.2)   O  U  T  P  U  T   Shift Total  (mL/kg)           Weight (kg)   73.5 73.5 73.5 73.5 73.5 73.5

## 2018-03-05 NOTE — Progress Notes (Signed)
1110 received report from April RN,pt waiting on transportation services for discharge.1132 d/c to home via wheelchair per private transportation,denies pain.

## 2018-03-05 NOTE — Anesthesia Post-Procedure Evaluation (Signed)
Anesthesia Post Note    Patient: Arthur Neal    Procedure(s) Performed: Procedure(s):  RELEASE LEFT COMMON PERONEAL NERVE    Anesthesia type: general endotracheal    Patient location: PACU    Post pain: Adequate analgesia    Post assessment: no apparent anesthetic complications, tolerated procedure well and no evidence of recall    Last Vitals:   Vitals:    03/05/18 0843 03/05/18 0848 03/05/18 0853 03/05/18 0908   BP: 125/79 151/72 130/68 141/73   BP Location: Right arm      Patient Position: Lying      Pulse: 72 75 70 69   Resp: 8 15 10 11    Temp: 97.4 F (36.3 C)      TempSrc: Axillary      SpO2: 100% 100% 100% 99%   Weight:       Height:            Post vital signs: stable    Level of consciousness: awake, alert  and oriented    Complications: None

## 2018-03-05 NOTE — Progress Notes (Signed)
Patient arrived from the OR to PACU bay 2 via stretcher. Patient on 2L O2 via nc. Bedside report received from anesthesia. Patient attached to monitor per PACU protocol. Vitals WNL. Will continue to monitor.

## 2018-03-05 NOTE — H&P (Signed)
ORTHOPAEDICS & SPORTS MEDICINE H&P UPDATE    Arthur Neal was seen and evaluated in the preoperative holding area.  There have been no significant changes since the completion of the History and Physical.  Patient was identified by surgeon.  Surgical site was marked by surgeon and verified by patient.   Surgical consent was obtained.   Patient is ready for OR.    Arthur Binney Konrad Saha, MD  03/05/2018 7:32 AM

## 2018-03-05 NOTE — Unmapped (Signed)
[phone]           [email or web address]    DISCHARGE INSTRUCTIONS    Call the Office [phone] or Return to an Emergency Department for:  1) Fever greater than 101.5??                                      2) Persistent nausea & vomiting                               3) Shortness of breath                  4) Persistent or increasing unrelieved pain  5) Foul smelling drainage from wounds/surgical sites  6) Increased redness, swelling, or warmth at wound/surgical sites.      Procedure: left peroneal nerve release   Date of Surgery: 03/05/18    DIET:  Regular Diet    ACTIVITY:      ?? As tolerated    ?? Ok to [employer], do not soak or submerge in tub/pool   ?? No pushing, pulling, lifting or carrying           RESTRICTIONS:    full as tolerated left leg    WOUND CARE:   ?? Leave sutures/steri-strips in place     ?? Keep incision clean and dry                                  ?? Dressing changes: Keep dressing in place until seen in follow up. Can change as needed if bloody or wet. OK to shower with tegaderm dressing. Keep an eye on it to make sure it stays sealed, otherwise needs to be changed  ?? No lotions or ointments on incision site        MEDICATIONS:   ?? Do not take Aspirin or Ibuprofen (Advil, Motrin, Naprosyn) products while taking Warfarin (Coumadin).  ?? OK to take Ibuprofen (Advil, Motrin, Naprosyn)   ?? Do not take additional Acetaminophen (Tylenol) products while taking combination medications Oxycodone/APAP (Percocet) or Hydrocodone/APAP (Vicodin, Norco).  OK to take Acetaminophen (Tylenol) if taking plain Oxycodone (Roxicodone).  Do not exceed 3000 mg Acetaminophen (Tylenol) in 24 hours.      ?? Percocet 5/325mg  1-2 tablets every 6hrs as needed for pain  ?? Continue gabapentin                                 *Take pain medication as prescribed. Do not drink alcohol, drive or operate heavy machinery while on narcotics.    DISCHARGE:  Home      FOLLOW UP:  Future Appointments  Date Time Provider  Department Center   03/21/2018 1:00 PM ORTHO PA, [facility]     Instructions Following General Anesthesia    You were given medicines that made you unconscious while a procedure was performed. For as long as 24 hours following this procedure, you may have:  ??? Dizziness, weakness, or drowsiness  ??? Sore throat or hoarseness. ??? Nausea or vomiting     For the first 24 hours following anesthesia:  ?? Have a responsible person with you at all times.  ?? Do not participate in any  activities where you could become injured for the next 24 hours, or until you feel normal again.  ?? Do not drive a car or operate heavy machinery.  ?? Do not drink alcohol, take sleeping pills, or other medications that cause drowsiness.  ?? Do not sign important papers or make important decisions.  ?? Only take medicine that has been prescribed by your caregiver.  For pain, only take over-the-counter or prescribed medicines for pain, discomfort, or fever as directed by your caregiver.  ?? If you wear a CPAP/BiPAP device, please wear it anytime that you are resting or feel tired.  ?? You may resume a normal diet unless otherwise directed.  Vomiting may occur if you eat too soon.  When you can drink without vomiting, try water, juice, or soup.  Try solid foods if you feel little or no nausea.  ?? If you have questions or problems that seem related to anesthesia, call the appropriate number below to speak with the on-call anesthesia provider.  ?? [place] [facility]: [phone]  ?? [facility] [phone]    SEEK IMMEDIATE MEDICAL CARE IF:   ??? You develop a rash.  ??? You have chest pain.  ??? You continue to feel sick to your stomach (nauseated) or throw up (vomit). ??? You have difficulty breathing.  ??? You develop any allergic reactions.  ??? You are unable to urinate.       Peripheral Nerve Blocks  Your caregiver may have placed a nerve block in one of your arms or legs to reduce pain and discomfort.  This is an injection of local anesthetic(s) and usually provides numbing pain relief up to 18 to 24 hours. You should notify your caregiver if you have any problems.  Be aware that you may lose feeling at and around the surgical area. If numbness happens, take proper measures to avoid injury to the affected extremity.  Do not stand up unassisted if you have a nerve block in your leg. Do not try to lift items if you have had a nerve block in your arm.  Please be careful when placing hot or cold items on a numb extremity.    SEEK IMMEDIATE MEDICAL CARE IF:  ?? You have redness, swelling, pain, or discharge at the injection site.  ?? You develop dizziness, lightheadedness, drowsiness, or confusion.  ?? There is a ringing or buzzing in your ears or blurred vision.  ?? You have a metal taste or numbness/tingling in or around your mouth.  ?? If you have continued numbness or weakness after 24 hours, please contact [place] and ask for the Pain Service to be paged [phone]).

## 2018-03-05 NOTE — Progress Notes (Signed)
Patient to SDS room 24, report per PACU nurse.
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
## 2018-03-05 NOTE — Brief Op Note (Signed)
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        Arthur Neal     Date: 03/05/2018  Time: 8:26 AM

## 2018-03-05 NOTE — Nursing Note (Signed)
Patient verbalized readiness for discharge. Discharge instructions and order in EPIC, reviewed with patient and wife, verbalized understanding, questions answered. Scripts x1 provided.

## 2018-03-05 NOTE — Unmapped (Signed)
ANESTHESIOLOGY PRE-PROCEDURAL EVALUATION    Arthur Neal is a 67 y.o. year old male presenting for:    Procedure(s):  RELEASE LEFT COMMON PERONEAL NERVE    Surgeon:   Marylyn Ishihara, MD    Anesthesia Evaluation    Patient summary reviewed, nursing notes reviewed and Previous anesthesia note reviewed.       No history of anesthetic complications   I have reviewed the History and Physical Exam, any relevant changes are noted in the anesthesia pre-operative evaluation.      Cardiovascular:    Exercise tolerance: good  Hypertension is well controlled.    (-) pacemaker, pulmonary hypertension, valvular problems/murmurs, past MI, CAD, cardiomyopathy, CABG/stent, dysrhythmias, angina.    Neuro/Muscoloskeletal/Psych:    (+) neuromuscular disease.    (-) seizures, TIA, CVA.     Pulmonary:      (-) no pneumonia, COPD, asthma, shortness of breath, recent URI, sleep apnea.       GI/Hepatic/Renal:    GERD is well controlled.    No bowel prep.  (-) PUD, hepatitis, liver disease, renal disease, no difficulty swallowing, no end stage liver disease.    Endo/Other:        (-) diabetes mellitus, hypothyroidism, hyperthyroidism, no anemia, no thrombocytopenia, no bleeding disorder.       PAST MEDICAL HISTORY:  Past Medical History:   Diagnosis Date   ??? Arthritis    ??? Back pain    ??? Benign prostatic hyperplasia    ??? 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 GERD (gastroesophageal reflux disease)    ??? Hypertension    ??? Pancreatitis    ??? Wears glasses        PAST SURGICAL HISTORY:  Past Surgical History:   Procedure Laterality Date   ??? AMPUTATION      Traumatic   ??? APPENDECTOMY     ??? CERVICAL LAMINECTOMY      C5-S1 diskectomy   ??? CT UPPER EXTREMITY LEFT W AND WO CONTRAST  2004    2014   ??? HERNIA REPAIR     ??? INCISION AND DRAINAGE  02/2008    s/p I&D, and revision and grafting   ??? SKIN GRAFT  2014    2009   ??? TENDON REPAIR Left     tendon & nerve repair left hand   ??? TRANSFORAMINAL LUMBAR INTERBODY FUSION N/A 02/03/2014    Procedure: LUMBAR  4-5 TRANSFORAMINAL LUMBAR INTERBODY FUSION, REMOVAL INSTRUMENTATION, REMOVAL SPINAL CORD STIMULATOR;  Surgeon: Cephus Shelling, MD;  Location: [facility] OR;  Service: Ortho Spine;  Laterality: N/A;       FAMILY HISTORY:  Family History   Problem Relation Age of Onset   ??? Heart attack Mother 60   ??? Heart attack Father 80   ??? Diabetes Brother        SOCIAL HISTORY:  Social History     Social History   ??? Marital status: Married     Spouse name: N/A   ??? Number of children: N/A   ??? Years of education: N/A     Occupational History   ??? Not on file.     Social History Main Topics   ??? Smoking status: Never Smoker   ??? Smokeless tobacco: Never Used      Comment: 04/23/2008   ??? Alcohol use Yes      Comment: 04/23/2008, occass   ??? Drug use: No      Comment: 04/23/2008   ??? Sexual activity: Yes  Partners: Female     Birth control/ protection: None     Other Topics Concern   ??? Not on file     Social History Narrative   ??? No narrative on file       MEDICATIONS:  Current Facility-Administered Medications on File Prior to Visit   Medication Dose Route Frequency Provider Last Rate Last Dose   ??? clindamycin (CLEOCIN) IVPB 900 mg  900 mg Intravenous On Call to OR Duffy Bruce, PA         Current Outpatient Prescriptions on File Prior to Visit   Medication Sig Dispense Refill   ??? B-complex with vitamin C (VITABEE WITH C) tablet Take 1 tablet by mouth daily.     ??? ferrous sulfate 324 mg (65 mg iron) TbEC Take 324 mg by mouth daily with breakfast.     ??? gabapentin (NEURONTIN) 800 MG tablet Take 800 mg by mouth 3 (three) times daily.       ??? HYDROcodone-acetaminophen (NORCO) 5-325 mg per tablet Take 1 tablet by mouth every 12 hours as needed for Pain. 40 tablet 0   ??? levothyroxine (SYNTHROID, LEVOTHROID) 50 MCG tablet Take 50 mcg by mouth every morning before breakfast.     ??? lipase-protease-amylase (ZENPEP DR, 20,000 UNITS OF LIPASE,) 20,000-68,000 -109,000 unit CpDR Take 1 capsule by mouth 3 times a day with meals.     ??? LUTEIN ORAL Take  by mouth.     ??? magnesium 30 mg tablet Take 250 mg by mouth daily.     ??? montelukast (SINGULAIR) 10 mg tablet Take 10 mg by mouth at bedtime.     ??? multivitamin capsule Take 1 capsule by mouth daily.     ??? NAPROXEN SODIUM (ALEVE ORAL) Take by mouth.     ??? pantoprazole (PROTONIX) 40 MG tablet Take 40 mg by mouth every morning before breakfast.     ??? predniSONE (DELTASONE) 10 MG tablet Take 1 tablet (10 mg total) by mouth daily. 39 tablet 0   ??? traMADol (ULTRAM) 50 mg tablet Take 50 mg by mouth every 6 hours as needed for Pain. 1 TAB PO 6 - 8 HOURS PRN #[id number]  CALLED INTO [facility] 04/07/14         ALLERGIES:  Allergies   Allergen Reactions   ??? Ace Inhibitors    ??? Ambien [Zolpidem]    ??? Amitriptyline    ??? Amitriptyline Hcl    ??? Amitriptyline Oxide Other (See Comments)     Muscle weakness and depression   ??? Cephalosporins Other (See Comments)     Generalize rash/confusion   ??? Fish Containing Products    ??? Iodine      IV CONTRAST FOR CT AND X-RAY   ??? Iodine And Iodide Containing Products    ??? Zolpidem Tartrate    ??? Cephalexin Hcl Rash   ??? Keflex [Cephalexin] Rash       VITAL SIGNS:  Wt Readings from Last 3 Encounters:   03/05/18 162 lb (73.5 kg)   01/31/18 162 lb (73.5 kg)   04/23/14 165 lb (74.8 kg)     Ht Readings from Last 3 Encounters:   03/05/18 5' 7 (1.702 m)   01/31/18 5' 7 (1.702 m)   04/23/14 5' 8 (1.727 m)     Temp Readings from Last 3 Encounters:   03/05/18 97.8 ??F (36.6 ??C) (Temporal)   02/05/14 98.2 ??F (36.8 ??C) (Oral)   06/20/13 98.3 ??F (36.8 ??C) (Oral)  BP Readings from Last 3 Encounters:   03/05/18 153/80   02/05/14 128/59   08/15/13 110/64     Pulse Readings from Last 3 Encounters:   03/05/18 80   02/05/14 107   06/20/13 67     SpO2 Readings from Last 3 Encounters:   08/15/13 99%   08/15/13 99%   08/15/13 99%       Airway:     Mallampati: I  Mouth Opening: >2 FB  TM distance: > = 3 FB  Neck ROM: full  (-) neck not short, not intubated    (+) facial hair     Dental:   - No obvious  cracked, loose, chipped, or missing teeth.         Pulmonary:   - normal exam    Breath sounds clear to auscultation.       Cardiovascular:  - normal exam   Rhythm: regular  Rate: normal    Neuro/Musculoskeletal/Psych:  - normal neurological exam.   Mental status: oriented to person, place and time.          Abdominal:    - normal exam  Not obese.    Current OB Status:       Other Findings:        LAB RESULTS:  Lab Results   Component Value Date    WBC 9.7 02/05/2014    HGB 10.8 (L) 02/05/2014    HCT 31.1 (L) 02/05/2014    MCV 86.2 02/05/2014    PLT 195 02/05/2014               Lab Results   Component Value Date    INR 0.9 02/03/2014       Lab Results   Component Value Date    ABOGROUP O 02/03/2014         ASA 2         Current non-smoker    Anesthesia Type:  general endotracheal.      PONV Risk Factors: current non-smoker,  plan for postoperative opioid use.                  (IV opioids for intraoperative and postoperative pain control.  Standard ASA monitors.  Peripheral IV x 1.  Adjuncts for pain and nausea as needed.  Risks, benefits and options discussed and the patient is comfortable moving forward with our anesthetic plan.  PACU for recovery.  )  Anesthetic plan and risks discussed with patient.    Plan, alternatives, and risks of anesthesia, including death, have been explained to and discussed with the patient/legal guardian.  By my assessment, the patient/legal guardian understands and agrees.  Scenario presented in detail.  Questions answered.    Use of blood products discussed with patient whom consented to blood products. Blood products not discussed.      Plan discussed with attending and CRNA.

## 2018-03-21 ENCOUNTER — Ambulatory Visit: Admit: 2018-03-21 | Discharge: 2018-03-21 | Payer: Medicare (Managed Care)

## 2018-03-21 DIAGNOSIS — G5702 Lesion of sciatic nerve, left lower limb: Secondary | ICD-10-CM

## 2018-03-21 NOTE — Unmapped (Signed)
Sutures were removed per the doctor's instruction.   Patient tolerated this well with no complications.   Steri strips were applied.   Patient was given instructions regarding showering/bathing.

## 2018-03-21 NOTE — Progress Notes (Signed)
This office note has been dictated.

## 2018-03-21 NOTE — Unmapped (Signed)
Brace (Medium ankle lace up) was applied per the doctor's instruction.   Patient tolerated this well with no complications.  Patient was given instructions regarding showering/bathing, brace usage.

## 2018-03-27 NOTE — Unmapped (Signed)
[facility] AND SPORTS MEDICINE     PATIENT NAME:  Arthur Neal, Arthur Neal                    MRN:  [id number]  DATE OF BIRTH:  [birth year]                       CSN:  [id number]  PROVIDER:  Aubery Lapping, M.D.                   VISIT DATE:  03/21/2018                                       OFFICE NOTE     Arthur Neal is seen in followup after we did a release of his left common peroneal  nerve.  Surgery was 03/05/2018.  His incision looks good.  We got all of his  sutures out today.  I was curious as to if he felt any improvement after the  surgery.  He really does not.  Even when I was doing the surgery I did not  see any specific area of compression of the peroneal nerve, so I told him  there may not be an actual compression.  There could be something internal  within the nerve itself that I cannot do much about, but anyway he is  resuming his activity with walking, etc.  He is certainly no worse as far as  weakness or numbness.  He still just has the numbness in the peroneal  distribution, so will just plan on checking him back in 4 to 6 weeks and see  how his nerve is coming along.                                              Aubery Lapping, M.D.  JDW/nwc  D:  03/26/2018 13:36  T:  03/27/2018 15:26  Job #:  [id number]           OFFICE NOTE                                                  PAGE    1 of   1

## 2018-04-25 ENCOUNTER — Ambulatory Visit: Admit: 2018-04-25 | Discharge: 2018-04-25 | Payer: Medicare (Managed Care)

## 2018-04-25 DIAGNOSIS — G5702 Lesion of sciatic nerve, left lower limb: Secondary | ICD-10-CM

## 2018-04-25 NOTE — Unmapped (Signed)
This office note has been dictated.

## 2018-05-02 NOTE — Unmapped (Signed)
[facility] AND SPORTS MEDICINE     PATIENT NAME:  FELIBERTO, STOCKLEY                    MRN:  [id number]  DATE OF BIRTH:  [birth year]                       CSN:  [id number]  PROVIDER:                                        VISIT DATE:  04/25/2018                                       OFFICE NOTE     Nat is seen in followup on his left peroneal nerve decompression from  03/05/2018.  He is doing okay. He states he may actually be feeling a little  bit better.  Does not feel like he has the same weakness.  Not really having  any pain.     I told we will just see with time if he does improve.  I would be interested  in checking an EMG on him.  He is probably going to be going down to [place]  here in the next month or 2 and I told him when he comes back in the spring  we should get an EMG.  I would like to do an EMG of his left lower extremity  before he comes back for his next follow-up appointment.  We will have that  ordered and hopefully completed before I see him.                                              Aubery Lapping, M.D.  JDW/sar  D:  05/01/2018 07:56  T:  05/02/2018 15:43  Job #:  [id number]           OFFICE NOTE                                                  PAGE    1 of   1

## 2019-01-24 NOTE — Unmapped (Signed)
Scheduled pt's EMG for 7/14 @2 :40p TDC. He will follow up with Dr. Linna Darner on 7/29 @9 :40a MAB.

## 2019-03-12 ENCOUNTER — Ambulatory Visit: Admit: 2019-03-12 | Discharge: 2019-03-12 | Payer: Medicare (Managed Care)

## 2019-03-12 DIAGNOSIS — R200 Anesthesia of skin: Secondary | ICD-10-CM
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
## 2019-03-18 NOTE — Unmapped (Signed)
COVID-19 Screen     SYMPTOMS:      1) Do you have a fever of 100.4 or higher? No  If yes,   ?? What has your fever been?   ?? How long have you had it?   ?? Have you taken any medication to treat the fever?      2) Do you have a new or worsening cough?  No  If yes,   ?? How would you describe your cough?   ?? How long has it been going on?   ?? Have you taken any medication to treat the cough?      3) Do you have new or worsening shortness of breath?  No  If yes,   ?? How long has it been going on?   ?? Have you taken any medication to relieve your shortness of breath?      4) Any other symptoms?  No         RISK FACTORS:      5) Has the patient had close contact with someone with a confirmed case of COVID-19 in the last 14 days?  No     6) Is the patient a direct healthcare worker OR do they work in an institutional home setting? (nursing home, dormitory, shelters, prison, etc.)  No     7) Does the patient have any of the following patient-related risk factors?  No           TIER 3 Patient? No           ADDITIONAL INFORMATION:

## 2019-03-19 ENCOUNTER — Ambulatory Visit: Admit: 2019-03-19 | Discharge: 2019-03-19 | Payer: Medicare (Managed Care)

## 2019-03-19 DIAGNOSIS — G5702 Lesion of sciatic nerve, left lower limb: Secondary | ICD-10-CM

## 2019-03-19 NOTE — Unmapped (Signed)
This office note has been dictated.

## 2019-03-20 NOTE — Unmapped (Signed)
[place]             [facility] AND SPORTS MEDICINE    PATIENT NAME:      Arthur Neal, Arthur Neal ??                 MRN:                 [id number]  DATE OF BIRTH:     [birth year]                    CSN:                 [id number]  PROVIDER:          Aubery Lapping, MD            VISIT DATE:                                   OFFICE NOTE      He is seen in followup on his bilateral lower extremity injuries.  On the left side, we did a peroneal nerve decompression on 03/05/2018.  Then he went down to the floor over the winter.  During the past several months, he has much improvement from the   peroneal nerve compression that he had and we ordered an EMG before he came back in today and it does look like he has had good recovery of his nerve function particularly motor.  There is still some sensory problems with the superficial portion of the   nerve, which he also states that he still has some numbness, but overall he is much, much better.  His problem today is on the right side which is where he has his BKA.  He is getting some breakdown over his fibular head.  I think just with the heat and   moisture that he is having some rubbing issues and probably has atrophied in the area too.  So I am going to have him go see his prosthetist first and see if they can do something with the fitting.  I told him it is a hard area to actually try to relieve   surgically because of the fibular head just not something we want to take out.  So anyway he is going to work with the prosthetist and see how that works for him.  I told him to leave the prosthesis off more frequently just to let things air out, so the   skin is not macerated and I will just check him back in a month or 2 if he is still having issues.        Aubery Lapping, MD      JDW/AQ  DD:?? 03/20/2019 13:29:33  DT:?? 03/21/2019 04:26:16    JOB#:  [id number]

## 2019-03-27 ENCOUNTER — Ambulatory Visit: Payer: Medicare (Managed Care)

## 2020-07-08 IMAGING — DX LUMBAR SPINE 2 VIEW
1 series · 2 of 2 positions shown · non-contrast
Comparison: None

LUMBAR SPINE 2 VIEW, 07/08/2020 [DATE]: 
CLINICAL INDICATION: Prescreening for MRI, evaluate for leads from stimulator 
which was removed
TECHNIQUE: 2 views lumbosacral spine

[Series 1: AP · U · 0.14mm/px · 2 of 2 slices shown]
[im 1/2]
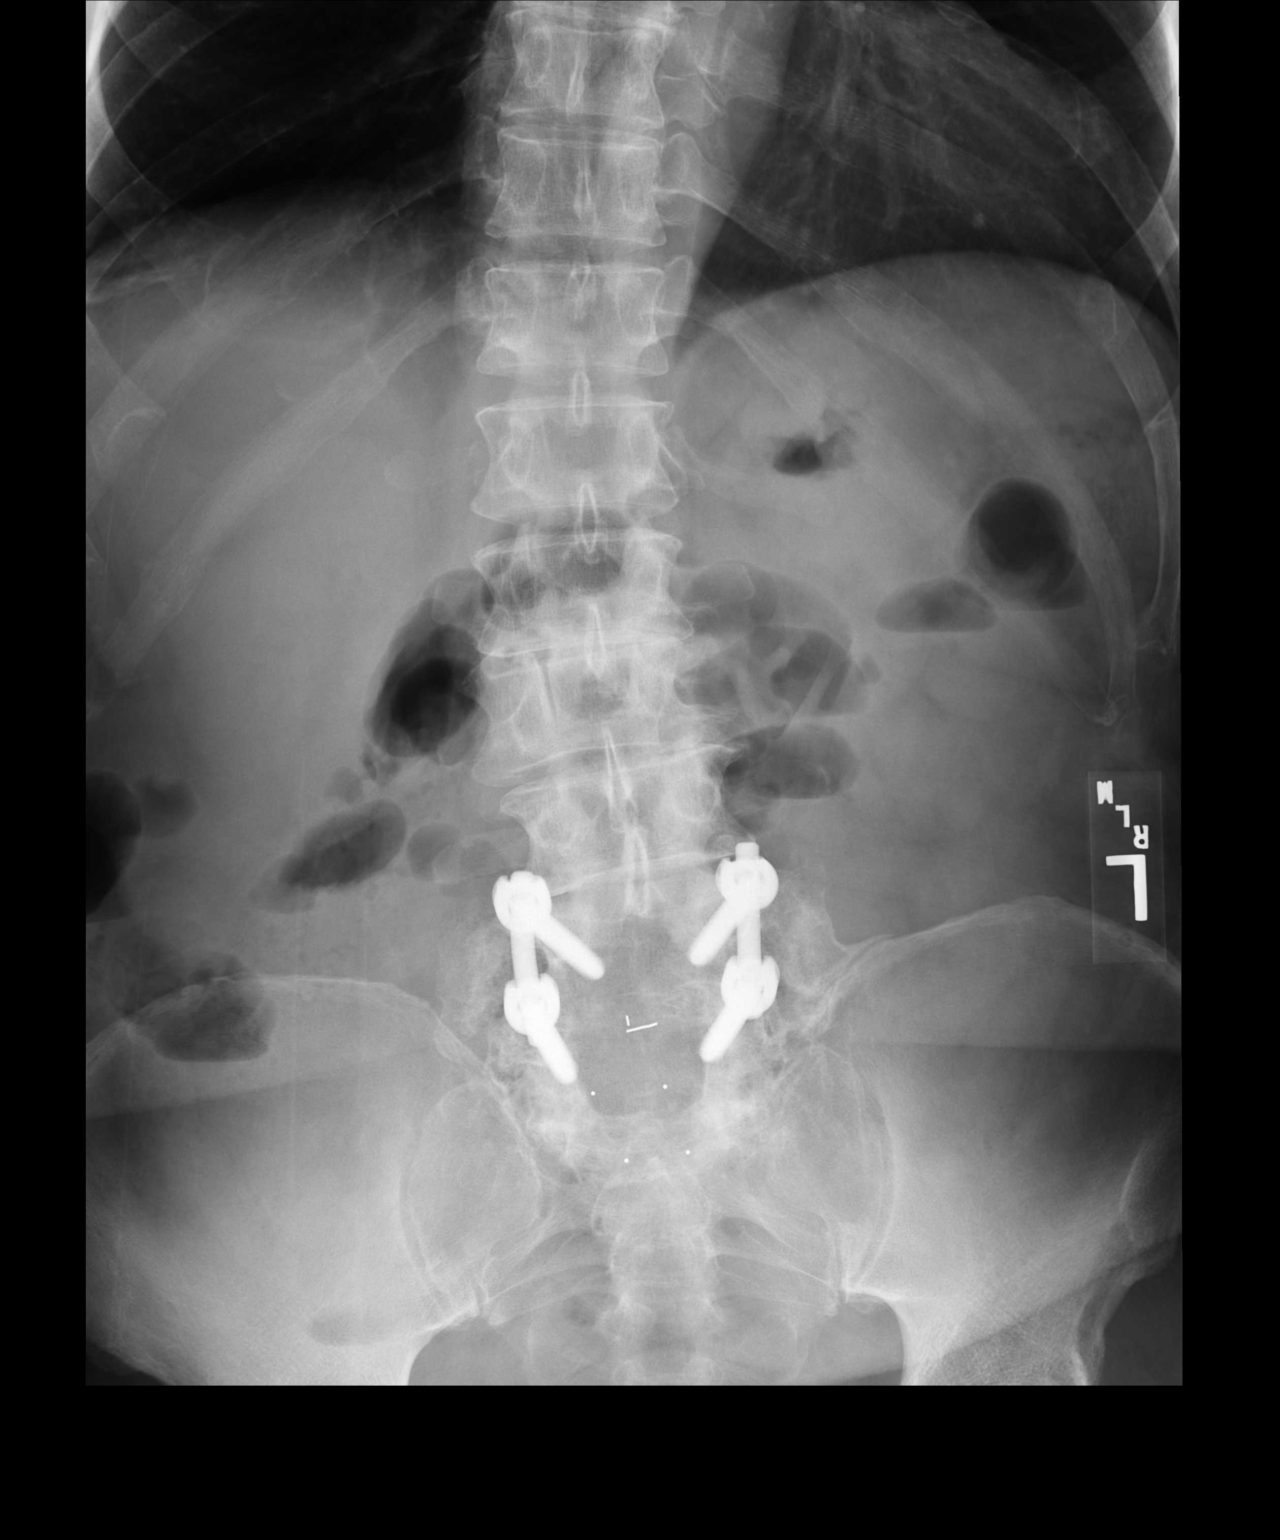
[im 2/2]
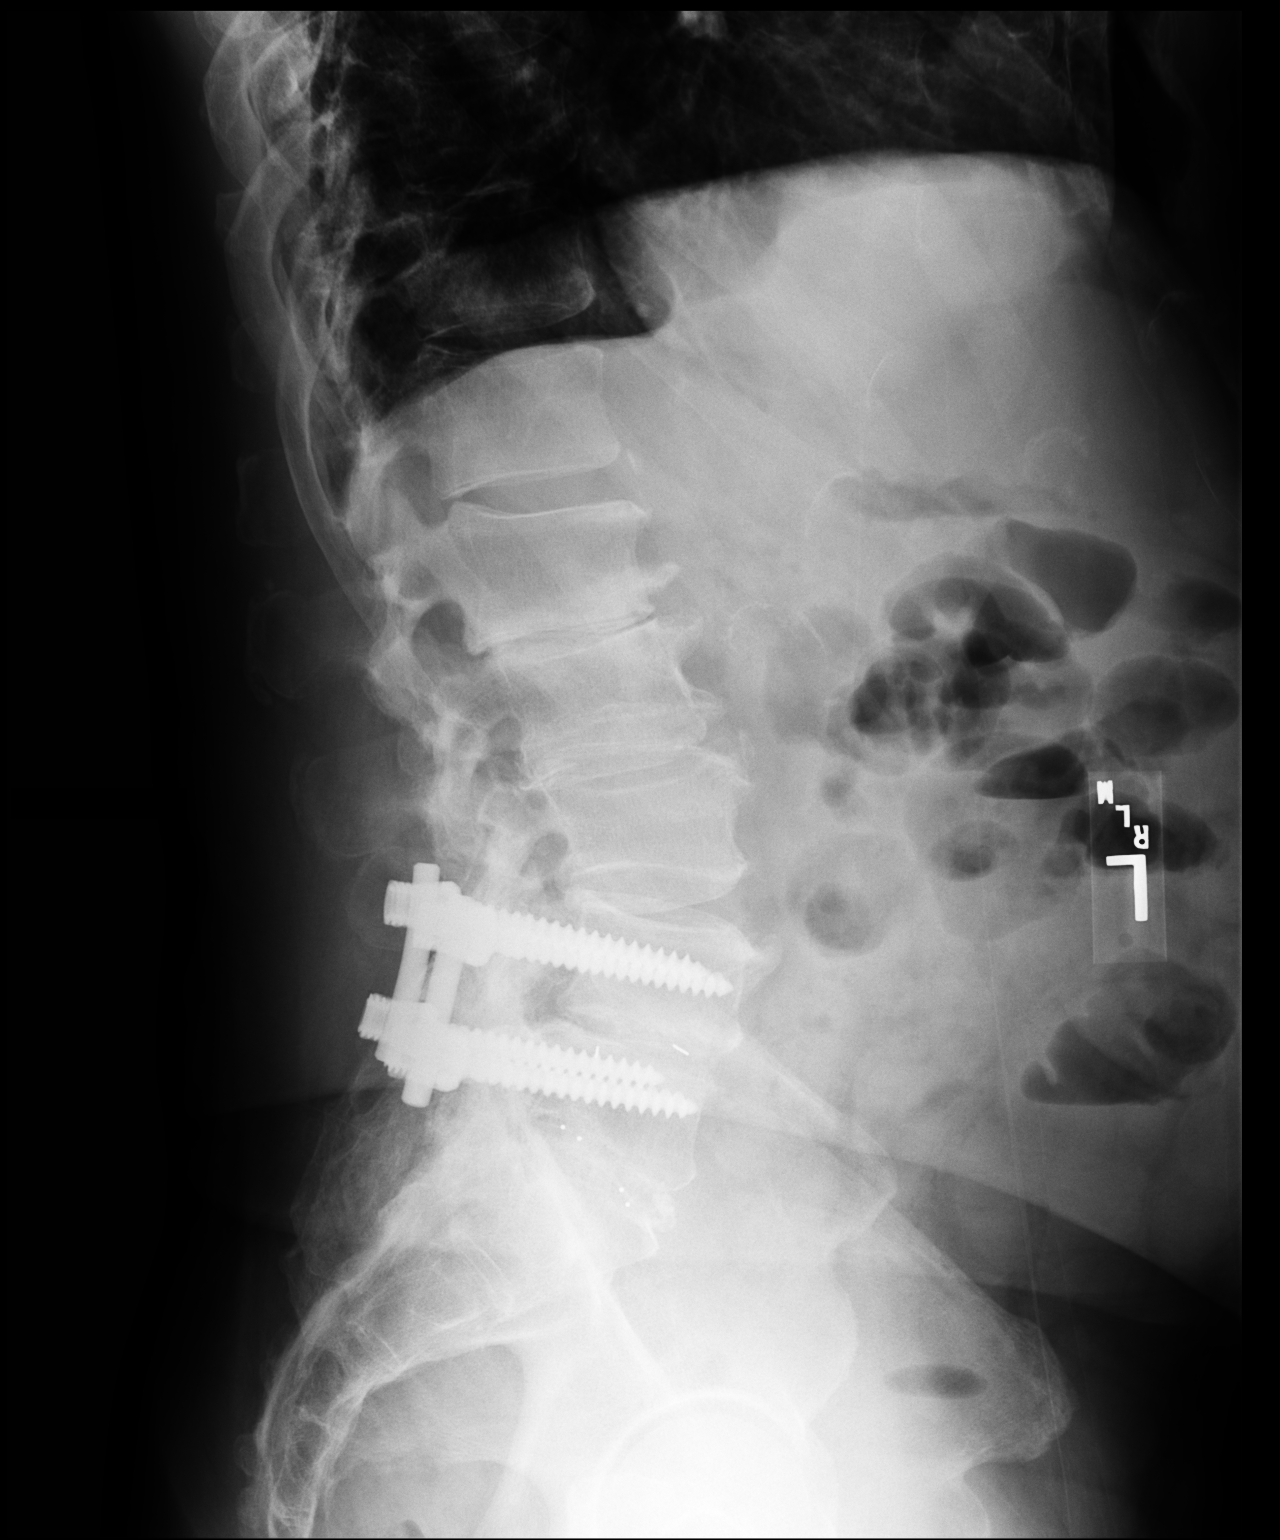

[2 of 2 positions shown; findings below may reference images not displayed]

FINDINGS: 6 nonrib-bearing lumbar-type vertebral bodies, the 2 lowest of which have been 
fused both with surgical plating and bone graft. Posterior decompression at the 
lowest 2 levels with intervertebral disc spacers. The intervertebral disc spacer 
markers at the level of the transpedicular fusion are linear. The lower level 
markers are punctate. No residual leads from stimulator identified. 
Dextroscoliosis upper lumbar spine. Advanced degenerative spondylosis upper 
lumbar spine with scattered degenerative spurring. No acute compression fracture 
identified.
IMPRESSION: No residual leads identified. Previous lower lumbar transpedicular and bony 
fusion with 2 disc spacers with different markers present.

## 2020-07-09 IMAGING — MR MRI BRAIN WITHOUT CONTRAST
5 of 9 series · 26 of 48 positions shown · non-contrast
Comparison: None

MRI BRAIN WITHOUT CONTRAST, 07/09/2020 [DATE]: 
CLINICAL INDICATION: Memory difficulty, confusion
TECHNIQUE: Axial T1, Axial T2, Axial FLAIR, Diffusion weighted images, Sagittal 
T1, and Coronal T2 MR images of the brain were performed without intravenous 
contrast enhancement.

[Series 101: survey · axial · 10.0mm · 0.98mm/px · z∈[+0,+125]mm · 2 of 5 slices shown]
[im 1/5]
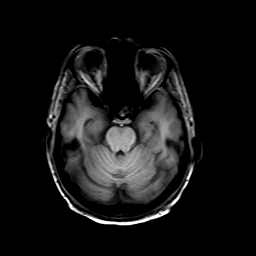
[im 5/5]
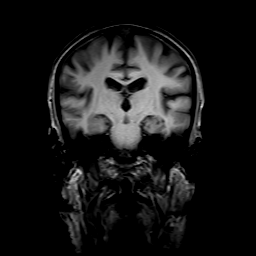

[Series 401: FLAIR · axial · 5.0mm · 0.65mm/px · z∈[-74,+67]mm · 4 of 25 slices shown]
[im 1/25]
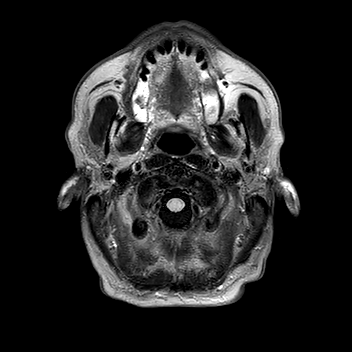
[im 9/25]
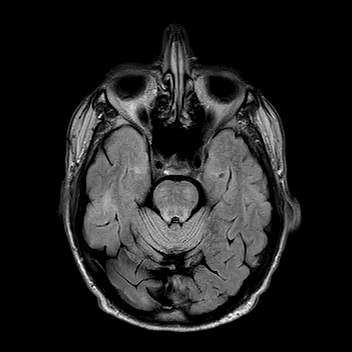
[im 17/25]
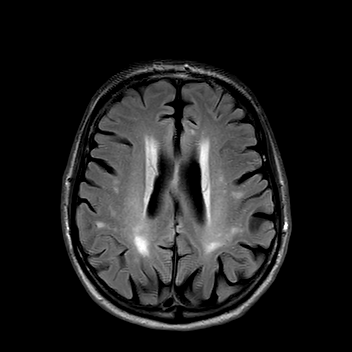
[im 25/25]
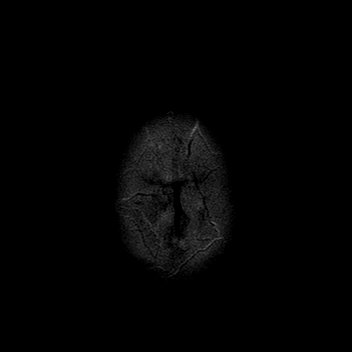

[Series 601: SWI · axial · 3.0mm · 0.53mm/px · z∈[-67,+70]mm · 10 of 100 slices shown]
[im 7/100]
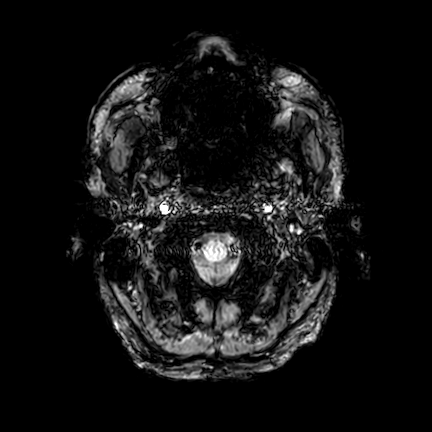
[im 14/100]
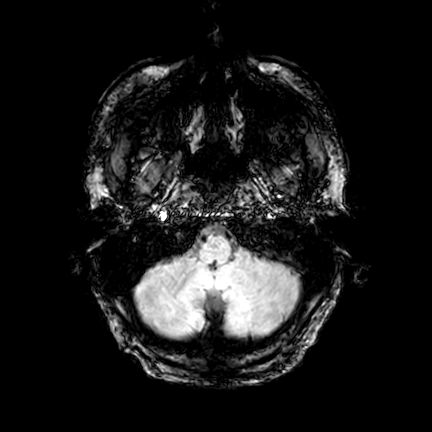
[im 20/100]
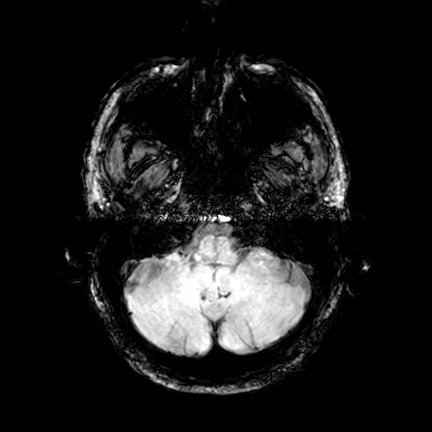
[im 34/100]
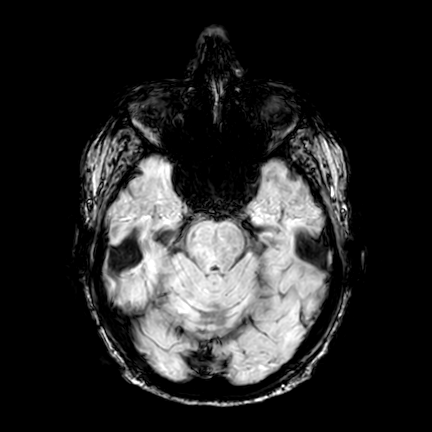
[im 47/100]
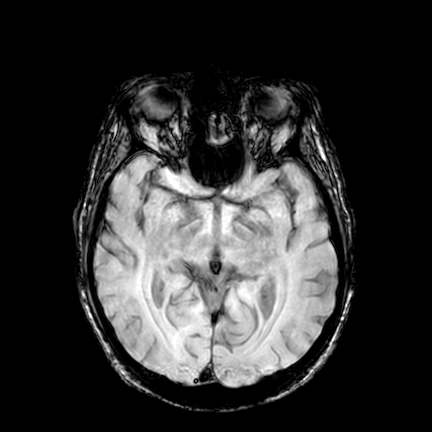
[im 53/100]
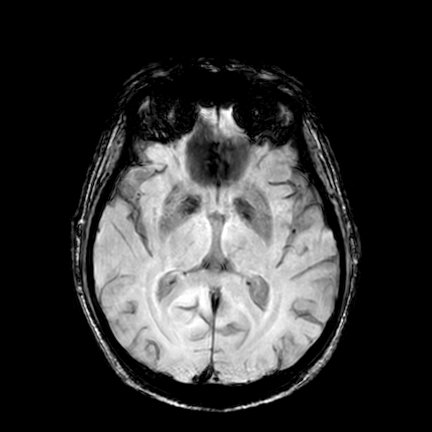
[im 60/100]
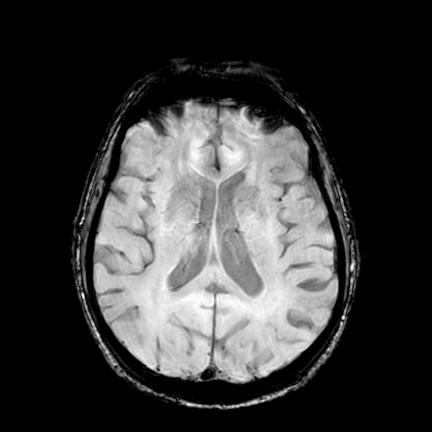
[im 73/100]
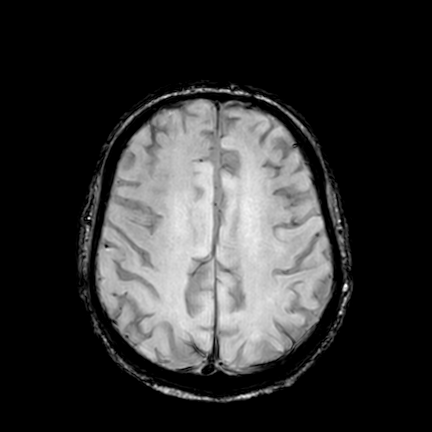
[im 86/100]
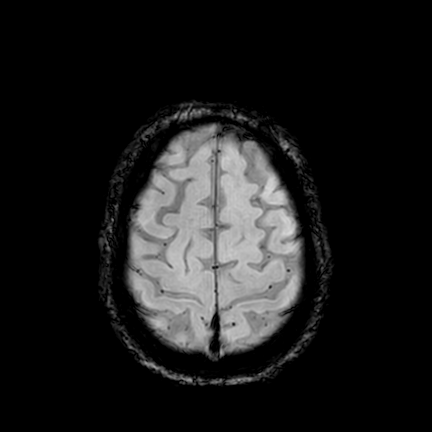
[im 100/100]
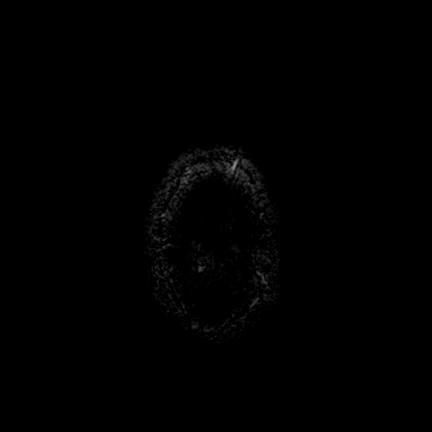

[Series 701: T2 · axial · 5.0mm · 0.41mm/px · z∈[-74,+67]mm · 4 of 25 slices shown (1 of 2)]
[im 1/25]
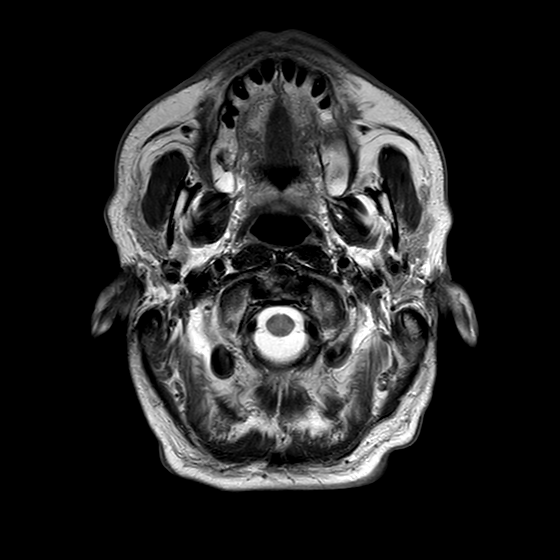
[im 9/25]
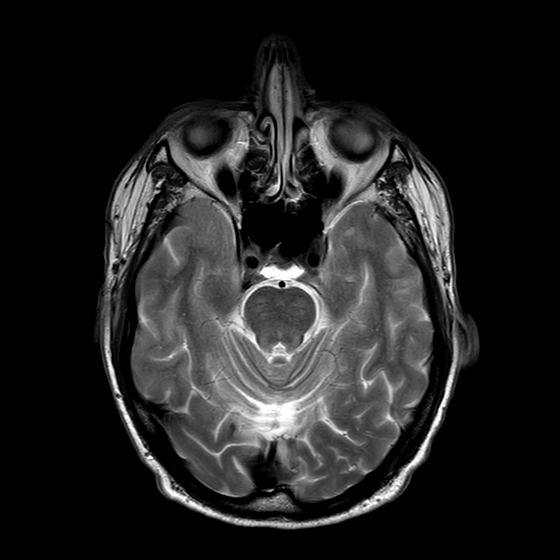
[im 17/25]
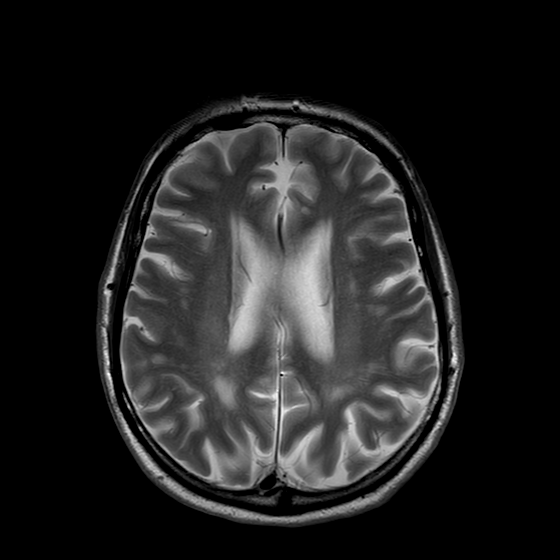
[im 25/25]
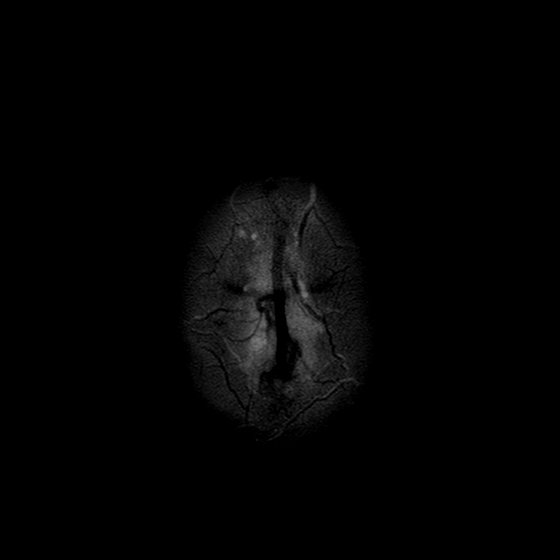

[Series 801: T2 · coronal · 4.0mm · 0.47mm/px · 6 of 36 slices shown (2 of 2)]
[im 1/36]
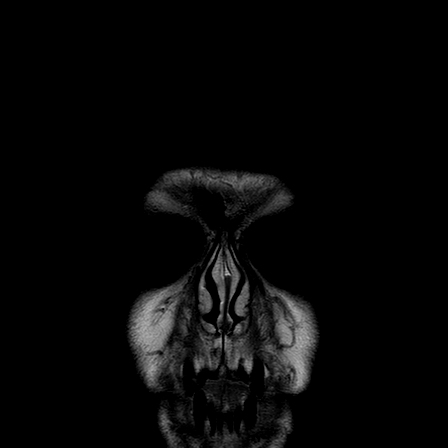
[im 8/36]
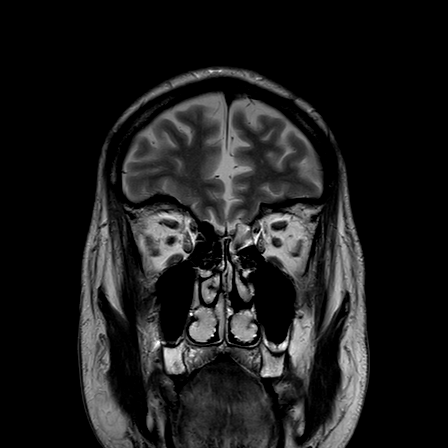
[im 15/36]
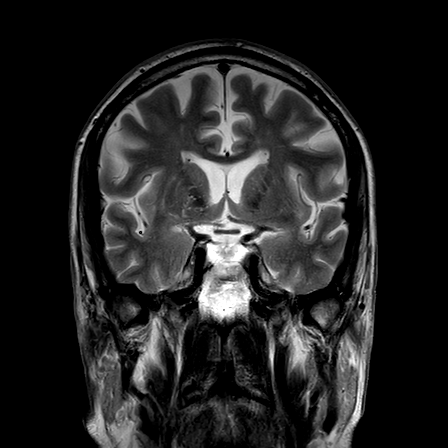
[im 22/36]
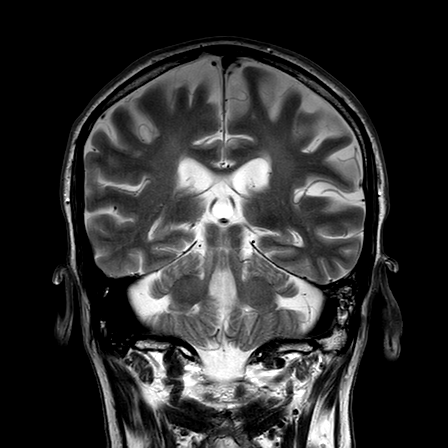
[im 29/36]
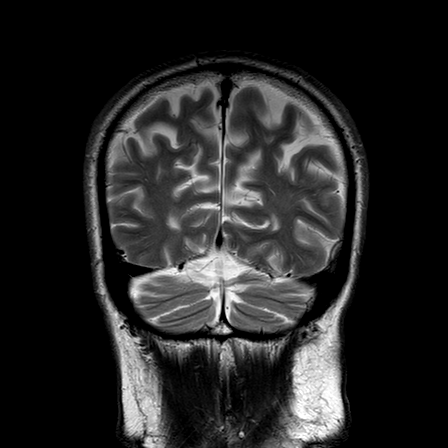
[im 36/36]
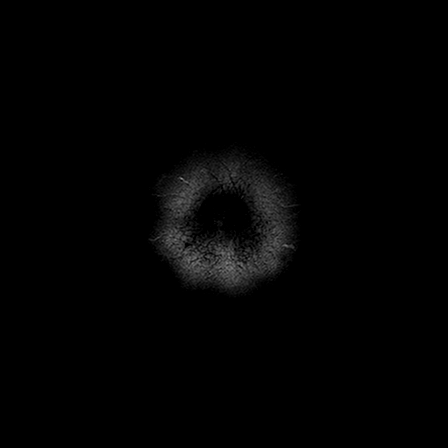

[26 of 48 positions shown; findings below may reference images not displayed]

FINDINGS: There is mild to moderate cerebral cortical atrophy, most pronounced 
along the upper convexities. There is relatively good hippocampal volume 
preservation. There is mild cerebellar volume loss. Moderately extensive T2 
hyperintense signal changes are seen in the cerebral white matter, with mild to 
moderate pontine involvement. No discrete cerebellar lesion. Diffusion images 
are negative. There is no hydrocephalus or intracranial mass. Major intracranial 
arterial segments and dural sinuses are Susceptibility sequences show no 
pathologic parenchymal hemosiderin deposition. Show no pathologic parenchymal 
hemosiderin 
There has been bilateral ocular lens implantation. There is moderate left 
ethmoid mucosal thickening. There is mild fluid in the left mastoid, likely 
chronic. Craniocervical junction is open. There are atlantoaxial degenerative 
changes with thickening of the transverse ligament. Sellar contents are normal.
IMPRESSION: Mild to moderate cortical atrophy, with moderately extensive T2 hyperintense 
signal changes in the cerebral white matter, mild to moderate pontine 
involvement. The signal changes are nonspecific but likely reflect chronic 
microangiopathy. 
No evidence for recent infarct, intracranial mass or hydrocephalus. 
Major intracranial arterial segments appear open.

## 2021-12-14 IMAGING — MR MRI RIGHT LOWER EXTREMITY W/WO CONTRAST
6 of 8 series · 26 of 40 positions shown · IV contrast (gadolinium)
Comparison: None

________________________________________________________________________________________________ 
MRI RIGHT LOWER EXTREMITY W/WO CONTRAST, 12/14/2021 [DATE]: 
CLINICAL INDICATION: Osteomyelitis, unspecified. Healed, slightly erythematous 
wound on stump.
TECHNIQUE: Multiplanar, multiecho position MR images of the right below-the-knee 
amputation were performed without and with intravenous gadolinium enhancement. 
6.5 mL of Gadavist were injected intravenously by hand. 1.0 mL of Gadavist were 
discarded. Patient was scanned on a 1.5T magnet.

[Series 301: survey_(person_name)_(person_name) · axial · 8.0mm · 0.78mm/px · z∈[-94,+62]mm · 4 of 15 slices shown]
[im 1/15]
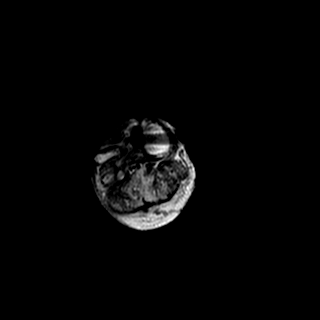
[im 5/15]
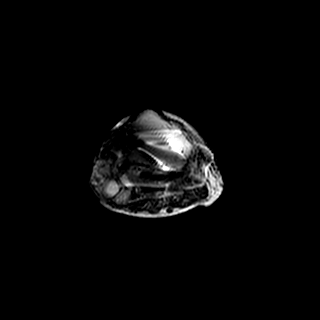
[im 10/15]
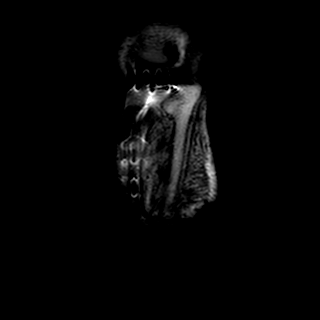
[im 15/15]
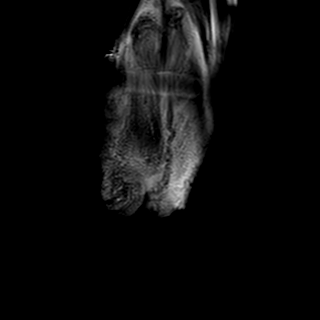

[Series 401: stir_(person_name)*-(person_name) · axial · 4.0mm · 0.45mm/px · z∈[-118,+36]mm · 6 of 32 slices shown]
[im 1/32]
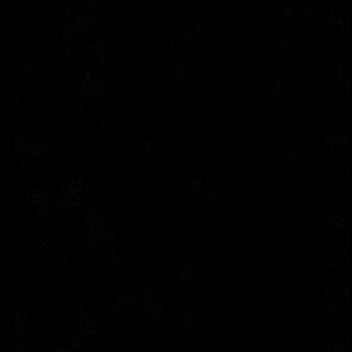
[im 7/32]
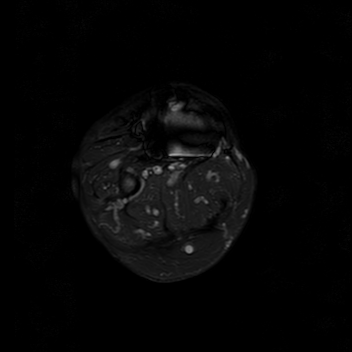
[im 13/32]
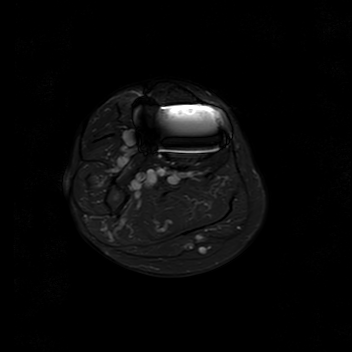
[im 19/32]
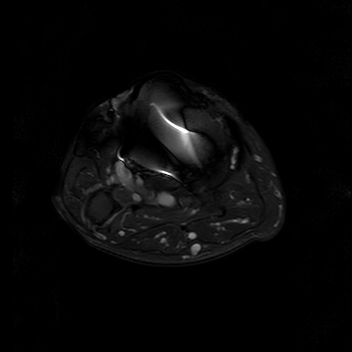
[im 25/32]
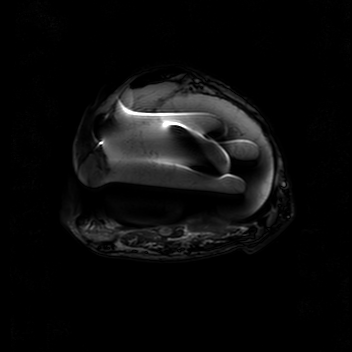
[im 32/32]
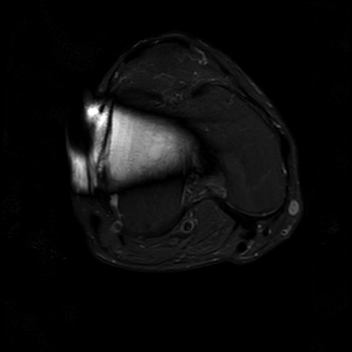

[Series 501: pd_sag fh*-(person_name) · sagittal · 3.0mm · 0.31mm/px · 5 of 32 slices shown]
[im 1/32]
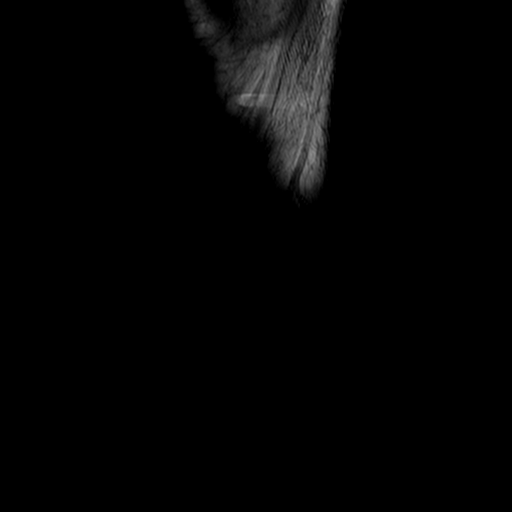
[im 8/32]
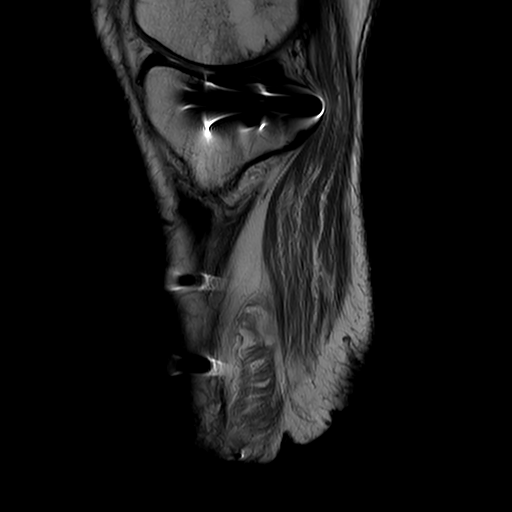
[im 16/32]
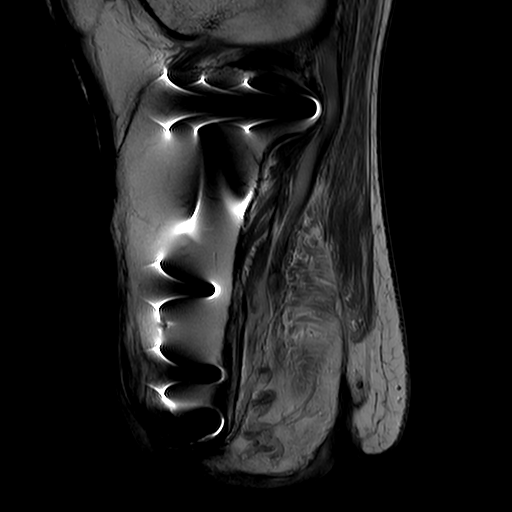
[im 24/32]
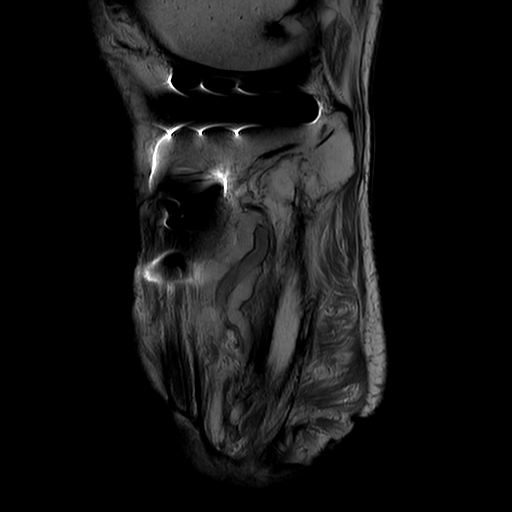
[im 32/32]
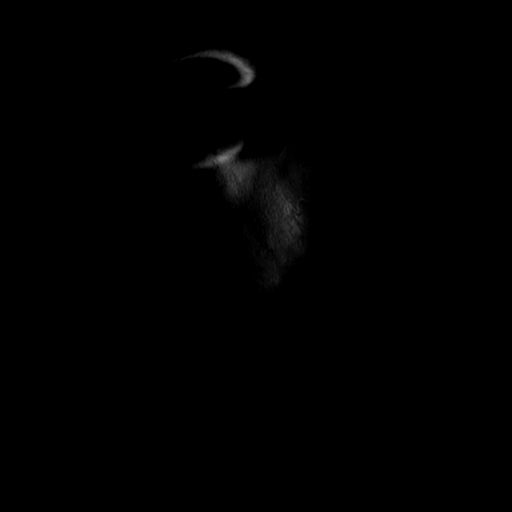

[Series 601: t1_(person_name) · coronal · 3.0mm · 0.31mm/px · 5 of 32 slices shown (1 of 2)]
[im 1/32]
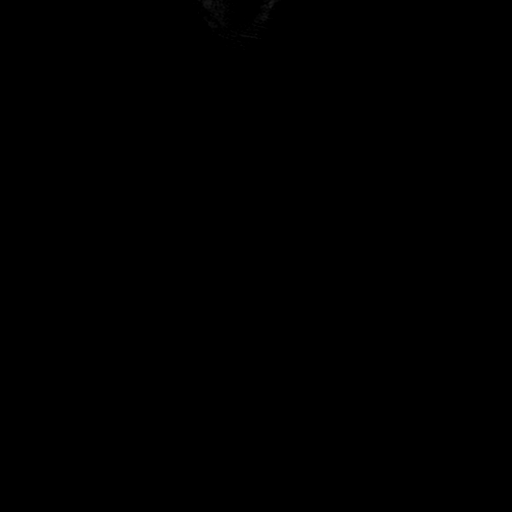
[im 8/32]
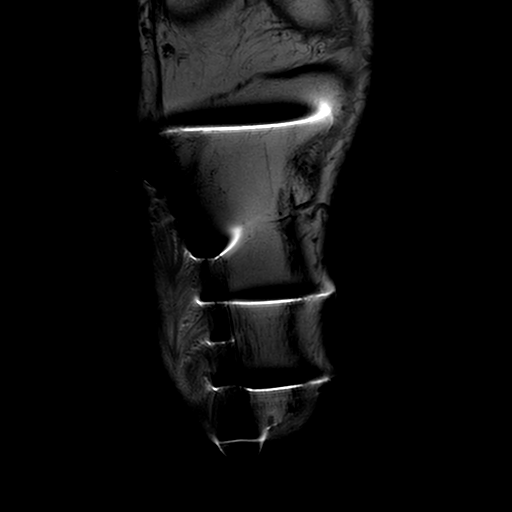
[im 16/32]
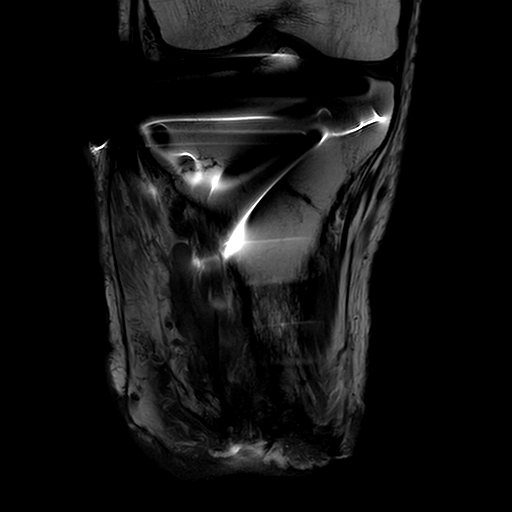
[im 24/32]
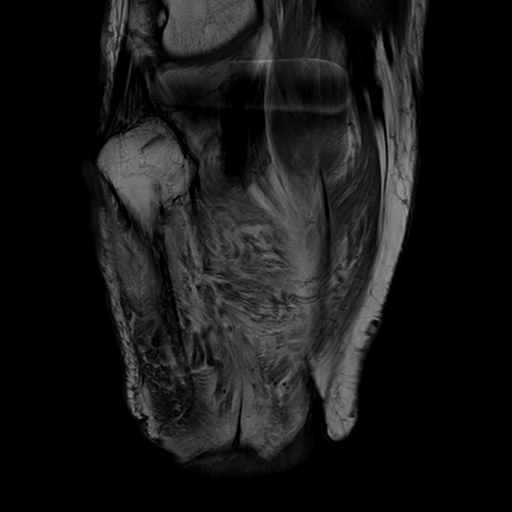
[im 32/32]
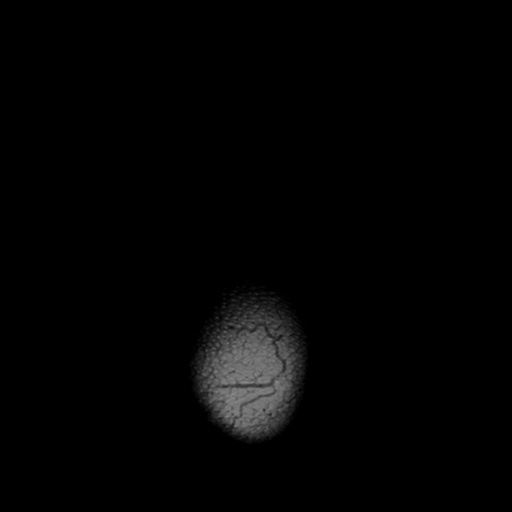

[Series 701: stir_(person_name)_(person_name) · coronal · 3.0mm · 0.40mm/px · 5 of 27 slices shown]
[im 1/27]
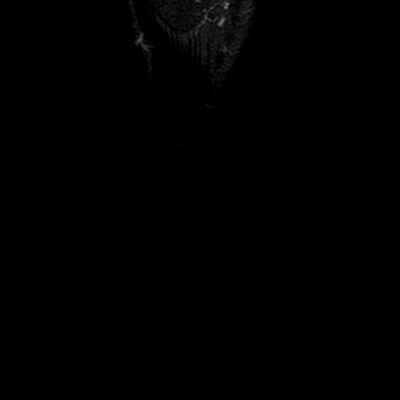
[im 7/27]
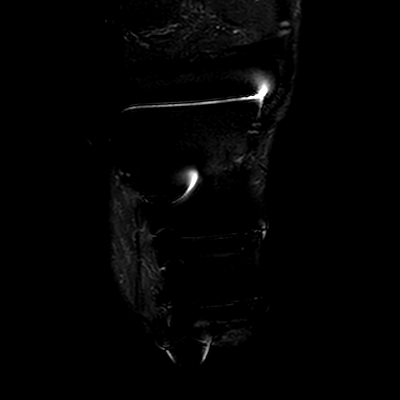
[im 14/27]
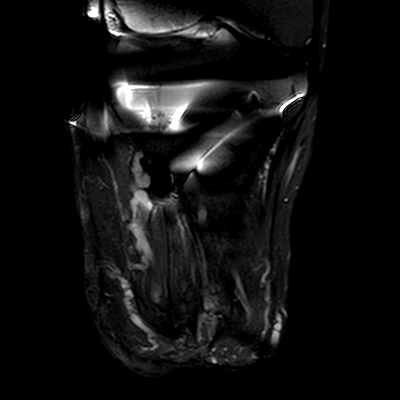
[im 20/27]
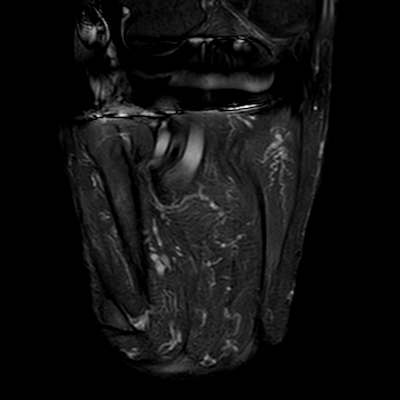
[im 27/27]
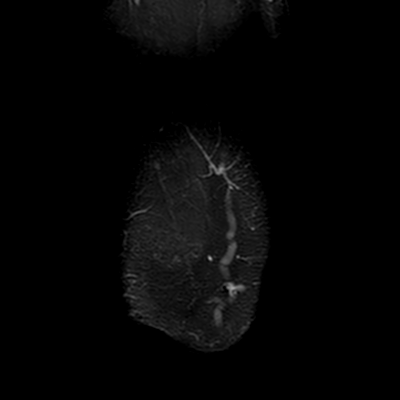

[Series 801: t1_(person_name) · axial · 4.0mm · 0.31mm/px · 1 of 32 slices shown (2 of 2)]
[im 1/32]
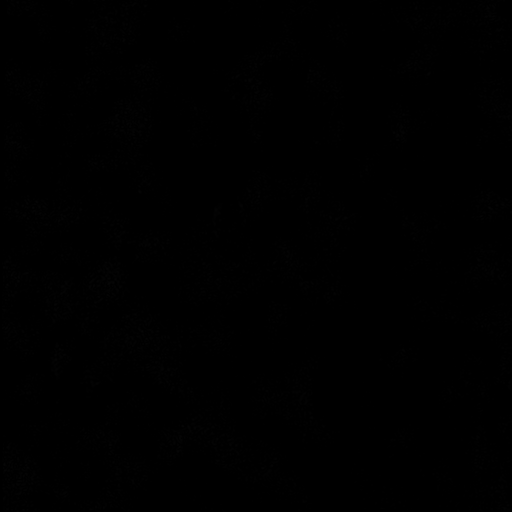

[26 of 40 positions shown; findings below may reference images not displayed]

FINDINGS: BONES: Below the knee amputation. Susceptibility artifact in the remaining 
proximal tibia with transfixed, chronic, healed tibial plateau fracture and 
lateral plate and screw fixation. Mild hypertrophic changes of the fibular stump 
without evidence of osteomyelitis. Susceptibility artifact distorts the lateral 
margin of the tibial stump. No evidence of osteomyelitis. 
JOINTS: The knee joint is partially obscured by metallic artifact. 
MUSCLES/TENDONS: Postsurgical change and moderate to severe fatty muscular 
atrophy. 
OTHER SOFT TISSUES: Rotated flap with thickening (0.5 cm) of the skin overlying 
the fibular stump. No mass or fluid collection. Subcutaneous tissues are 
negative.
IMPRESSION: 1.  No osteomyelitis or abscess. 
2.  Below the knee amputation and transfixed, chronic, healed tibial plateau 
fracture.  
3.  Mild hypertrophic changes of the fibular stump with mild overlying skin 
thickening.
